# Patient Record
Sex: Male | Born: 1965 | Race: Black or African American | Hispanic: No | Marital: Single | Smoking: Current every day smoker
Health system: Southern US, Community
[De-identification: ages and names within clinical notes are randomized; demographics above are authoritative.]

## PROBLEM LIST (undated history)

## (undated) DIAGNOSIS — B1920 Unspecified viral hepatitis C without hepatic coma: Secondary | ICD-10-CM

## (undated) DIAGNOSIS — B20 Human immunodeficiency virus [HIV] disease: Secondary | ICD-10-CM

## (undated) HISTORY — DX: Unspecified viral hepatitis C without hepatic coma: B19.20

## (undated) HISTORY — PX: UMBILICAL HERNIA REPAIR: SHX196

---

## 1998-01-22 ENCOUNTER — Emergency Department (HOSPITAL_COMMUNITY): Admission: EM | Admit: 1998-01-22 | Discharge: 1998-01-22 | Payer: Self-pay | Admitting: Emergency Medicine

## 1998-10-28 ENCOUNTER — Emergency Department (HOSPITAL_COMMUNITY): Admission: EM | Admit: 1998-10-28 | Discharge: 1998-10-28 | Payer: Self-pay | Admitting: Emergency Medicine

## 1998-11-03 ENCOUNTER — Encounter: Admission: RE | Admit: 1998-11-03 | Discharge: 1998-11-03 | Payer: Self-pay | Admitting: Internal Medicine

## 1998-11-04 ENCOUNTER — Encounter: Admission: RE | Admit: 1998-11-04 | Discharge: 1998-11-04 | Payer: Self-pay | Admitting: Hematology and Oncology

## 1998-11-04 ENCOUNTER — Ambulatory Visit (HOSPITAL_COMMUNITY): Admission: RE | Admit: 1998-11-04 | Discharge: 1998-11-04 | Payer: Self-pay | Admitting: Hematology and Oncology

## 1998-11-04 ENCOUNTER — Ambulatory Visit (HOSPITAL_COMMUNITY): Admission: RE | Admit: 1998-11-04 | Discharge: 1998-11-04 | Payer: Self-pay | Admitting: Internal Medicine

## 1998-11-17 ENCOUNTER — Encounter: Admission: RE | Admit: 1998-11-17 | Discharge: 1998-11-17 | Payer: Self-pay | Admitting: Internal Medicine

## 1998-11-23 ENCOUNTER — Encounter: Admission: RE | Admit: 1998-11-23 | Discharge: 1998-11-23 | Payer: Self-pay | Admitting: Internal Medicine

## 1998-11-30 ENCOUNTER — Encounter: Admission: RE | Admit: 1998-11-30 | Discharge: 1998-11-30 | Payer: Self-pay | Admitting: Hematology and Oncology

## 1998-12-12 ENCOUNTER — Emergency Department (HOSPITAL_COMMUNITY): Admission: EM | Admit: 1998-12-12 | Discharge: 1998-12-12 | Payer: Self-pay | Admitting: Emergency Medicine

## 1998-12-22 ENCOUNTER — Encounter: Admission: RE | Admit: 1998-12-22 | Discharge: 1998-12-22 | Payer: Self-pay | Admitting: Internal Medicine

## 1998-12-22 ENCOUNTER — Ambulatory Visit (HOSPITAL_COMMUNITY): Admission: RE | Admit: 1998-12-22 | Discharge: 1998-12-22 | Payer: Self-pay | Admitting: Internal Medicine

## 1999-02-02 ENCOUNTER — Encounter: Admission: RE | Admit: 1999-02-02 | Discharge: 1999-02-02 | Payer: Self-pay | Admitting: Internal Medicine

## 1999-02-02 ENCOUNTER — Ambulatory Visit (HOSPITAL_COMMUNITY): Admission: RE | Admit: 1999-02-02 | Discharge: 1999-02-02 | Payer: Self-pay | Admitting: Internal Medicine

## 1999-03-31 ENCOUNTER — Ambulatory Visit (HOSPITAL_COMMUNITY): Admission: RE | Admit: 1999-03-31 | Discharge: 1999-03-31 | Payer: Self-pay | Admitting: Internal Medicine

## 1999-03-31 ENCOUNTER — Encounter: Admission: RE | Admit: 1999-03-31 | Discharge: 1999-03-31 | Payer: Self-pay | Admitting: Internal Medicine

## 1999-06-02 ENCOUNTER — Encounter: Admission: RE | Admit: 1999-06-02 | Discharge: 1999-06-02 | Payer: Self-pay | Admitting: Internal Medicine

## 1999-08-24 ENCOUNTER — Ambulatory Visit (HOSPITAL_COMMUNITY): Admission: RE | Admit: 1999-08-24 | Discharge: 1999-08-24 | Payer: Self-pay | Admitting: Internal Medicine

## 1999-08-24 ENCOUNTER — Encounter: Admission: RE | Admit: 1999-08-24 | Discharge: 1999-08-24 | Payer: Self-pay | Admitting: Internal Medicine

## 1999-12-07 ENCOUNTER — Ambulatory Visit (HOSPITAL_COMMUNITY): Admission: RE | Admit: 1999-12-07 | Discharge: 1999-12-07 | Payer: Self-pay | Admitting: Internal Medicine

## 1999-12-07 ENCOUNTER — Encounter: Admission: RE | Admit: 1999-12-07 | Discharge: 1999-12-07 | Payer: Self-pay | Admitting: Internal Medicine

## 2000-03-21 ENCOUNTER — Ambulatory Visit (HOSPITAL_COMMUNITY): Admission: RE | Admit: 2000-03-21 | Discharge: 2000-03-21 | Payer: Self-pay | Admitting: Internal Medicine

## 2000-03-21 ENCOUNTER — Encounter: Admission: RE | Admit: 2000-03-21 | Discharge: 2000-03-21 | Payer: Self-pay | Admitting: Internal Medicine

## 2000-06-27 ENCOUNTER — Encounter: Admission: RE | Admit: 2000-06-27 | Discharge: 2000-06-27 | Payer: Self-pay | Admitting: Internal Medicine

## 2000-06-27 ENCOUNTER — Ambulatory Visit (HOSPITAL_COMMUNITY): Admission: RE | Admit: 2000-06-27 | Discharge: 2000-06-27 | Payer: Self-pay | Admitting: Internal Medicine

## 2000-10-10 ENCOUNTER — Encounter: Admission: RE | Admit: 2000-10-10 | Discharge: 2000-10-10 | Payer: Self-pay | Admitting: Internal Medicine

## 2000-10-10 ENCOUNTER — Ambulatory Visit (HOSPITAL_COMMUNITY): Admission: RE | Admit: 2000-10-10 | Discharge: 2000-10-10 | Payer: Self-pay | Admitting: Internal Medicine

## 2001-03-13 ENCOUNTER — Encounter: Admission: RE | Admit: 2001-03-13 | Discharge: 2001-03-13 | Payer: Self-pay | Admitting: Internal Medicine

## 2001-03-13 ENCOUNTER — Ambulatory Visit (HOSPITAL_COMMUNITY): Admission: RE | Admit: 2001-03-13 | Discharge: 2001-03-13 | Payer: Self-pay | Admitting: Internal Medicine

## 2001-07-17 ENCOUNTER — Ambulatory Visit (HOSPITAL_COMMUNITY): Admission: RE | Admit: 2001-07-17 | Discharge: 2001-07-17 | Payer: Self-pay | Admitting: Internal Medicine

## 2001-07-17 ENCOUNTER — Encounter: Admission: RE | Admit: 2001-07-17 | Discharge: 2001-07-17 | Payer: Self-pay | Admitting: Internal Medicine

## 2001-09-10 ENCOUNTER — Encounter: Admission: RE | Admit: 2001-09-10 | Discharge: 2001-09-10 | Payer: Self-pay | Admitting: Internal Medicine

## 2001-11-27 ENCOUNTER — Encounter: Admission: RE | Admit: 2001-11-27 | Discharge: 2001-11-27 | Payer: Self-pay | Admitting: Internal Medicine

## 2001-11-27 ENCOUNTER — Ambulatory Visit (HOSPITAL_COMMUNITY): Admission: RE | Admit: 2001-11-27 | Discharge: 2001-11-27 | Payer: Self-pay | Admitting: Internal Medicine

## 2002-01-30 ENCOUNTER — Encounter: Admission: RE | Admit: 2002-01-30 | Discharge: 2002-01-30 | Payer: Self-pay | Admitting: Internal Medicine

## 2010-04-12 ENCOUNTER — Other Ambulatory Visit: Payer: Self-pay | Admitting: Internal Medicine

## 2010-04-12 ENCOUNTER — Emergency Department (HOSPITAL_COMMUNITY): Admission: EM | Admit: 2010-04-12 | Discharge: 2010-04-12 | Payer: Self-pay | Admitting: Family Medicine

## 2010-05-25 ENCOUNTER — Telehealth: Payer: Self-pay | Admitting: Internal Medicine

## 2010-05-25 ENCOUNTER — Ambulatory Visit: Payer: Self-pay | Admitting: Internal Medicine

## 2010-05-25 LAB — CONVERTED CEMR LAB
ALT: 30 units/L (ref 0–53)
AST: 27 units/L (ref 0–37)
Albumin: 4 g/dL (ref 3.5–5.2)
Alkaline Phosphatase: 70 units/L (ref 39–117)
BUN: 9 mg/dL (ref 6–23)
Basophils Absolute: 0 10*3/uL (ref 0.0–0.1)
Basophils Relative: 0 % (ref 0–1)
CO2: 24 meq/L (ref 19–32)
Calcium: 8.9 mg/dL (ref 8.4–10.5)
Chloride: 106 meq/L (ref 96–112)
Cholesterol: 173 mg/dL (ref 0–200)
Creatinine, Ser: 0.84 mg/dL (ref 0.40–1.50)
Eosinophils Absolute: 0.1 10*3/uL (ref 0.0–0.7)
Eosinophils Relative: 2 % (ref 0–5)
Glucose, Bld: 100 mg/dL — ABNORMAL HIGH (ref 70–99)
HCT: 45 % (ref 39.0–52.0)
HCV Ab: REACTIVE — AB
HDL: 77 mg/dL (ref >39)
HIV 1 RNA Quant: <20 copies/mL (ref <20)
HIV-1 RNA Quant, Log: <1.3 (ref <1.30)
Hemoglobin: 15.5 g/dL (ref 13.0–17.0)
Hep A Total Ab: POSITIVE — AB
LDL Cholesterol: 76 mg/dL (ref 0–99)
Lymphocytes Relative: 37 % (ref 12–46)
Lymphs Abs: 2.3 10*3/uL (ref 0.7–4.0)
MCHC: 34.4 g/dL (ref 30.0–36.0)
MCV: 93.9 fL (ref 78.0–100.0)
Monocytes Absolute: 0.5 10*3/uL (ref 0.1–1.0)
Monocytes Relative: 9 % (ref 3–12)
Neutro Abs: 3.3 10*3/uL (ref 1.7–7.7)
Neutrophils Relative %: 52 % (ref 43–77)
Platelets: 284 10*3/uL (ref 150–400)
Potassium: 4.4 meq/L (ref 3.5–5.3)
RBC: 4.79 M/uL (ref 4.22–5.81)
RDW: 13.7 % (ref 11.5–15.5)
Sodium: 140 meq/L (ref 135–145)
Total Bilirubin: 0.6 mg/dL (ref 0.3–1.2)
Total CHOL/HDL Ratio: 2.2
Total Protein: 6.9 g/dL (ref 6.0–8.3)
Triglycerides: 99 mg/dL (ref <150)
VLDL: 20 mg/dL (ref 0–40)
WBC: 6.3 10*3/uL (ref 4.0–10.5)

## 2010-05-26 ENCOUNTER — Encounter: Payer: Self-pay | Admitting: Internal Medicine

## 2010-05-26 LAB — CONVERTED CEMR LAB
Chlamydia, Swab/Urine, PCR: NEGATIVE
GC Probe Amp, Urine: NEGATIVE
Hemoglobin, Urine: NEGATIVE
Leukocytes, UA: NEGATIVE
Nitrite: NEGATIVE
Protein, ur: NEGATIVE mg/dL
Specific Gravity, Urine: 1.025 (ref 1.005–1.030)
Urine Glucose: NEGATIVE mg/dL
Urobilinogen, UA: 0.2 (ref 0.0–1.0)
pH: 5.5 (ref 5.0–8.0)

## 2010-05-27 ENCOUNTER — Emergency Department (HOSPITAL_COMMUNITY): Admission: EM | Admit: 2010-05-27 | Discharge: 2010-05-27 | Payer: Self-pay | Admitting: Family Medicine

## 2010-06-07 ENCOUNTER — Ambulatory Visit: Payer: Self-pay | Admitting: Internal Medicine

## 2010-06-07 DIAGNOSIS — B20 Human immunodeficiency virus [HIV] disease: Secondary | ICD-10-CM | POA: Insufficient documentation

## 2010-06-25 ENCOUNTER — Encounter: Payer: Self-pay | Admitting: Internal Medicine

## 2010-07-08 ENCOUNTER — Encounter: Payer: Self-pay | Admitting: Internal Medicine

## 2010-09-07 ENCOUNTER — Encounter: Payer: Self-pay | Admitting: Internal Medicine

## 2010-09-07 ENCOUNTER — Ambulatory Visit
Admission: RE | Admit: 2010-09-07 | Discharge: 2010-09-07 | Payer: Self-pay | Source: Home / Self Care | Attending: Internal Medicine | Admitting: Internal Medicine

## 2010-09-07 LAB — CONVERTED CEMR LAB
ALT: 33 units/L (ref 0–53)
AST: 29 units/L (ref 0–37)
Albumin: 4.3 g/dL (ref 3.5–5.2)
Alkaline Phosphatase: 79 units/L (ref 39–117)
BUN: 13 mg/dL (ref 6–23)
Basophils Absolute: 0 10*3/uL (ref 0.0–0.1)
Basophils Relative: 0 % (ref 0–1)
CO2: 24 meq/L (ref 19–32)
Calcium: 9.8 mg/dL (ref 8.4–10.5)
Chloride: 108 meq/L (ref 96–112)
Creatinine, Ser: 0.86 mg/dL (ref 0.40–1.50)
Eosinophils Absolute: 0.1 10*3/uL (ref 0.0–0.7)
Eosinophils Relative: 2 % (ref 0–5)
Glucose, Bld: 61 mg/dL — ABNORMAL LOW (ref 70–99)
HCT: 45.1 % (ref 39.0–52.0)
HIV 1 RNA Quant: <20 copies/mL (ref <20)
HIV-1 RNA Quant, Log: <1.3 (ref <1.30)
Hemoglobin: 15.3 g/dL (ref 13.0–17.0)
Lymphocytes Relative: 31 % (ref 12–46)
Lymphs Abs: 1.8 10*3/uL (ref 0.7–4.0)
MCHC: 33.9 g/dL (ref 30.0–36.0)
MCV: 97.6 fL (ref 78.0–100.0)
Monocytes Absolute: 0.6 10*3/uL (ref 0.1–1.0)
Monocytes Relative: 10 % (ref 3–12)
Neutro Abs: 3.3 10*3/uL (ref 1.7–7.7)
Neutrophils Relative %: 57 % (ref 43–77)
Platelets: 271 10*3/uL (ref 150–400)
Potassium: 5.1 meq/L (ref 3.5–5.3)
RBC: 4.62 M/uL (ref 4.22–5.81)
RDW: 13.1 % (ref 11.5–15.5)
Sodium: 142 meq/L (ref 135–145)
Total Bilirubin: 0.4 mg/dL (ref 0.3–1.2)
Total Protein: 7.1 g/dL (ref 6.0–8.3)
WBC: 5.8 10*3/uL (ref 4.0–10.5)

## 2010-09-10 ENCOUNTER — Encounter: Payer: Self-pay | Admitting: Internal Medicine

## 2010-09-20 ENCOUNTER — Ambulatory Visit: Admit: 2010-09-20 | Payer: Self-pay | Admitting: Internal Medicine

## 2010-10-07 NOTE — Miscellaneous (Signed)
Summary: MEDICAL REPORT  MEDICAL REPORT   Imported By: Margie Billet 07/08/2010 10:41:28  _____________________________________________________________________  External Attachment:    Type:   Image     Comment:   External Document

## 2010-10-07 NOTE — Miscellaneous (Signed)
Summary: RW Update  Clinical Lists Changes  Observations: Added new observation of RWPARTICIP: Yes (09/10/2010 10:28)

## 2010-10-07 NOTE — Miscellaneous (Signed)
Summary: Orders Update  Clinical Lists Changes  Orders: Added new Test order of T-Chlamydia  Probe, urine [id number]) - Signed Added new Test order of T-Comprehensive Metabolic Panel [id number]) - Signed Added new Test order of T-Lipid Profile ([id number]) - Signed Added new Test order of T-CBC w/Diff (44010-27253) - Signed Added new Test order of T-CD4SP [facility]) (CD4SP) - Signed Added new Test order of T-GC Probe, urine [phone]) - Signed Added new Test order of T-Hepatitis A Antibody ([id number]) - Signed Added new Test order of T-Hepatitis C Antibody ([id number]) - Signed Added new Test order of T-Urinalysis ([id number]) - Signed Added new Test order of T-HIV1 Quant rflx Ultra or Genotype ([id number]) - Signed Added new Test order of T-RPR (Syphilis) ([id number]) - Signed

## 2010-10-07 NOTE — Assessment & Plan Note (Signed)
Summary: new 042/tkk   CC:  new pt. to establish, lab results, and would like help in gaining weight.  History of Present Illness: This is the first ID clinic visit for [facility]. He recently moved here from [place] after his partner died.  He is living with his mother who helps take care of his 44 year old daughter. He was diagnosed HIV (+) in 2000.  Initially treated with combivir and Sustiva and then switched to Atripla which he tolerates well. He is in the process of applying for ADAP and was given a month supply of Atripla.  Preventive Screening-Counseling & Management  Alcohol-Tobacco     Alcohol drinks/day: occasional     Alcohol type: beer     Smoking Status: current     Packs/Day: 0.5     Year Started: 1978  Caffeine-Diet-Exercise     Caffeine use/day: none     Does Patient Exercise: no  Safety-Violence-Falls     Seat Belt Use: yes      Sexual History:  n/a.        Drug Use:  never.        Blood Transfusions:  no.    Comments: pt. given condoms   Updated Prior Medication List: ATRIPLA 600-200-300 MG TABS (EFAVIRENZ-EMTRICITAB-TENOFOVIR) Take 1 tablet by mouth at bedtime  Current Allergies (reviewed today): No known allergies  Social History: Sexual History:  n/a Drug Use:  never Blood Transfusions:  no  Review of Systems  The patient denies anorexia, fever, prolonged cough, and headaches.    Vital Signs:  Patient profile:   45 year old male Height:      67 inches (170.18 cm) Weight:      109.0 pounds (49.55 kg) BMI:     17.13 Temp:     97.8 degrees F (36.56 degrees C) oral Pulse rate:   76 / minute BP sitting:   121 / 84  (right arm)  Vitals Entered By: Wendall Mola CMA Duncan Dull) (June 07, 2010 2:11 PM) CC: new pt. to establish, lab results, would like help in gaining weight Is Patient Diabetic? No Pain Assessment Patient in pain? no      Nutritional Status BMI of < 19 = underweight Nutritional Status Detail appetite  "good"  Does patient need assistance? Functional Status Self care Ambulation Normal Comments no missed doses of meds per pt.   Physical Exam  General:  alert, well-developed, well-nourished, and well-hydrated.   Head:  normocephalic and atraumatic.   Mouth:  pharynx pink and moist.  no thrush  Lungs:  normal breath sounds.      Impression & Recommendations:  Problem # 1:  HIV INFECTION (ICD-042) Pt.s most recent CD4ct was 490 and VL <20.  Pt instructed to continue the current antiretroviral regimen.  Pt encouraged to take medication regularly and not miss doses.  Pt will f/u in 3 months for repeat blood work and will see me 2 weeks later.  Ptrefused Influenza vaccine.  Diagnostics Reviewed:  CD4: 490 (05/26/2010)   WBC: 6.3 (05/25/2010)   Hgb: 15.5 (05/25/2010)   HCT: 45.0 (05/25/2010)   Platelets: 284 (05/25/2010) HIV-1 RNA: <20 copies/mL (05/25/2010)     Orders: New Patient Level III (99203)Future Orders: T-CD4SP ([facility]) (CD4SP) ... 09/05/2010 T-HIV Viral Load [id number]) ... 09/05/2010 T-Comprehensive Metabolic Panel [phone]) ... 09/05/2010 T-CBC w/Diff ([id number]) ... 09/05/2010  Patient Instructions: 1)  Please schedule a follow-up appointment in 3 months, 2 weeks after labs.    Not Administered:  Influenza Vaccine not given due to: declined

## 2010-10-07 NOTE — Miscellaneous (Signed)
Summary: [place] [facility]  [place] [facility]   Imported By: Florinda Marker 06/28/2010 10:01:54  _____________________________________________________________________  External Attachment:    Type:   Image     Comment:   External Document

## 2010-10-07 NOTE — Progress Notes (Signed)
Summary: sample given to pt per [place]       New/Updated Medications: ATRIPLA 600-200-300 MG TABS (EFAVIRENZ-EMTRICITAB-TENOFOVIR) Take 1 tablet by mouth at bedtime Patient here for intake/labs.  He stated that he is transfering in from [place].  Patient said he has [place] Medicaid now.  He ran out of his Atripla last night and will need some to hold him over til seen by physician in 2 weeks.  I asked Dr. Orvan Falconer (the physician on site) if pateint can have a bottle of Atripla.  Dr. Orvan Falconer has approved this.  Sample Given, Lot #: [id number] Expiration Date:12 2013 Patient has been instructed regarding the correct time, dose and frequency of taking this med, including desired effects and most common side effects. Prescriptions: ATRIPLA 600-200-300 MG TABS (EFAVIRENZ-EMTRICITAB-TENOFOVIR) Take 1 tablet by mouth at bedtime  #30 x 0   Entered by:   Paulo Fruit  BS,CPht II,MPH   Authorized by:   Cliffton Asters MD   Signed by:   Cliffton Asters MD on 05/25/2010   Method used:   Samples Given   RxID:   [id number]  Paulo Fruit  BS,CPht II,MPH  May 25, 2010 9:18 AM

## 2010-10-14 ENCOUNTER — Encounter (INDEPENDENT_AMBULATORY_CARE_PROVIDER_SITE_OTHER): Payer: Self-pay | Admitting: *Deleted

## 2010-10-18 ENCOUNTER — Encounter: Payer: Self-pay | Admitting: Adult Health

## 2010-10-20 ENCOUNTER — Encounter (INDEPENDENT_AMBULATORY_CARE_PROVIDER_SITE_OTHER): Payer: Self-pay | Admitting: *Deleted

## 2010-10-21 NOTE — Miscellaneous (Signed)
  Clinical Lists Changes  Observations: Added new observation of PAYOR: Medicaid (10/14/2010 15:24) Added new observation of LATINO/HISP: No (10/14/2010 15:24) Added new observation of RACE: African American (10/14/2010 15:24)

## 2010-10-27 NOTE — Consult Note (Signed)
Summary: [facility]   Imported By: Florinda Marker 10/18/2010 13:25:27  _____________________________________________________________________  External Attachment:    Type:   Image     Comment:   External Document

## 2010-10-27 NOTE — Miscellaneous (Signed)
Summary: RW update  Clinical Lists Changes  Observations: Added new observation of GENDER: Male (10/20/2010 14:54) Added new observation of MARITAL STAT: Single (10/20/2010 14:54)

## 2010-11-15 ENCOUNTER — Ambulatory Visit (INDEPENDENT_AMBULATORY_CARE_PROVIDER_SITE_OTHER): Payer: Self-pay | Admitting: Infectious Diseases

## 2010-11-15 ENCOUNTER — Encounter: Payer: Self-pay | Admitting: Infectious Diseases

## 2010-11-15 ENCOUNTER — Telehealth: Payer: Self-pay | Admitting: *Deleted

## 2010-11-15 DIAGNOSIS — F17200 Nicotine dependence, unspecified, uncomplicated: Secondary | ICD-10-CM | POA: Insufficient documentation

## 2010-11-15 DIAGNOSIS — B20 Human immunodeficiency virus [HIV] disease: Secondary | ICD-10-CM

## 2010-11-15 DIAGNOSIS — Z23 Encounter for immunization: Secondary | ICD-10-CM

## 2010-11-15 LAB — T-HELPER CELL (CD4) - (RCID CLINIC ONLY)
CD4 % Helper T Cell: 23 % — ABNORMAL LOW (ref 33–55)
CD4 T Cell Abs: 430 uL (ref 400–2700)

## 2010-11-18 LAB — T-HELPER CELL (CD4) - (RCID CLINIC ONLY)
CD4 % Helper T Cell: 23 % — ABNORMAL LOW (ref 33–55)
CD4 T Cell Abs: 490 uL (ref 400–2700)

## 2010-11-19 LAB — HIV-1 RNA, QUALITATIVE, TMA: HIV-1 RNA, Qualitative, TMA: DETECTED

## 2010-11-19 LAB — HIV 1/2 CONFIRMATION
HIV-1 antibody: POSITIVE
HIV-2 Ab: NEGATIVE

## 2010-11-19 LAB — HEPATITIS PANEL, ACUTE
HCV Ab: REACTIVE — AB
Hep A IgM: NEGATIVE
Hep B C IgM: NEGATIVE
Hepatitis B Surface Ag: NEGATIVE

## 2010-11-19 LAB — T-HELPER CELLS (CD4) COUNT (NOT AT ARMC)
CD4 % Helper T Cell: 22 % — ABNORMAL LOW (ref 33–55)
CD4 T Cell Abs: 410 uL (ref 400–2700)

## 2010-11-19 LAB — RPR: RPR Ser Ql: NONREACTIVE

## 2010-11-23 NOTE — Miscellaneous (Signed)
  Clinical Lists Changes  Medications: Rx of ATRIPLA 600-200-300 MG TABS (EFAVIRENZ-EMTRICITAB-TENOFOVIR) Take 1 tablet by mouth at bedtime;  #30 x 5;  Signed;  Entered by: Wendall Mola CMA ( AAMA);  Authorized by: Johny Sax MD;  Method used: Electronically to [facility] [phone]*, [address] [place], [place]  [address], Ph: [phone], Fax: [phone] Orders: Added new Service order of Pneumococcal Vaccine (88416) - Signed Added new Service order of Admin 1st Vaccine (60630) - Signed Observations: Added new observation of PNEUMOVAXVIS: 08/10/09 version given November 15, 2010. (11/15/2010 11:05) Added new observation of PNEUMOVAXLOT: 1314AA (11/15/2010 11:05) Added new observation of PNEUMOVAXEXP: 01/23/2012 (11/15/2010 11:05) Added new observation of PNEUMOVAXBY: Wendall Mola CMA ( AAMA) (11/15/2010 11:05) Added new observation of PNEUMOVAXRTE: IM (11/15/2010 11:05) Added new observation of PNEUMOVAXDOS: 0.5 ml (11/15/2010 11:05) Added new observation of PNEUMOVAXMFR: Merck (11/15/2010 11:05) Added new observation of PNEUMOVAXSIT: right deltoid (11/15/2010 11:05) Added new observation of PNEUMOVAX: Pneumovax (11/15/2010 11:05)    Prescriptions: ATRIPLA 600-200-300 MG TABS (EFAVIRENZ-EMTRICITAB-TENOFOVIR) Take 1 tablet by mouth at bedtime  #30 x 5   Entered by:   Wendall Mola CMA ( AAMA)   Authorized by:   Johny Sax MD   Signed by:   Wendall Mola CMA ( AAMA) on 11/15/2010   Method used:   Electronically to        [facility] [address] [phone]* (retail)       [address]       [place], [place]  [address]       Ph: [phone]       Fax: [phone]   RxID:   [id number]    Immunizations Administered:  Pneumonia Vaccine:    Vaccine Type: Pneumovax    Site: right deltoid    Mfr: Merck    Dose: 0.5 ml    Route: IM    Given by: Wendall Mola CMA ( AAMA)    Exp. Date: 01/23/2012    Lot #: [id number]    VIS given:  08/10/09 version given November 15, 2010.

## 2010-11-23 NOTE — Progress Notes (Signed)
Summary: [facility] referral  Phone Note Outgoing Call   Call placed by: Annice Pih Summary of Call: Referral made to [facility] Initial call taken by: Wendall Mola CMA Duncan Dull),  November 15, 2010 11:10 AM

## 2010-11-23 NOTE — Assessment & Plan Note (Signed)
Summary: new to md 44month f/u [mkj]   Vital Signs:  Patient profile:   45 year old male Height:      67 inches (170.18 cm) Weight:      112.8 pounds (51.27 kg) BMI:     17.73 Temp:     98.5 degrees F (36.94 degrees C) oral Pulse rate:   90 / minute BP sitting:   110 / 85  (right arm)  Vitals Entered By: Wendall Mola CMA Duncan Dull) (November 15, 2010 10:24 AM) CC: follow-up visit, lab results Is Patient Diabetic? No Pain Assessment Patient in pain? no      Nutritional Status BMI of < 19 = underweight Nutritional Status Detail appetite "great"  Have you ever been in a relationship where you felt threatened, hurt or afraid?No   Does patient need assistance? Functional Status Self care Ambulation Normal Comments no missed doses of Atripla per pt.   CC:  follow-up visit and lab results.  History of Present Illness: 45 yo M with HIV+ diagnosed in 2000. Was prev in [place] for care, on atripla.  Feeling well, no problems with meds. Here with daughter today.  CD4 430 and VL <20 (09-07-10)   Preventive Screening-Counseling & Management  Alcohol-Tobacco     Alcohol drinks/day: occasional     Alcohol type: beer     Smoking Status: current     Packs/Day: 0.5     Year Started: 1978  Caffeine-Diet-Exercise     Caffeine use/day: none     Does Patient Exercise: yes     Type of exercise: walking  Safety-Violence-Falls     Seat Belt Use: yes      Sexual History:  n/a.        Drug Use:  yes.        Blood Transfusions:  no.    Comments: pt. declined condoms  Current Medications (verified): 1)  Atripla 600-200-300 Mg Tabs (Efavirenz-Emtricitab-Tenofovir) .... Take 1 Tablet By Mouth At Bedtime  Allergies (verified): No Known Drug Allergies  Past History:  Past Medical History: Current Problems:  TOBACCO ABUSE (ICD-305.1) HIV INFECTION (ICD-042)  Family History: mother with HTN. mother knows status.   Social History: Single Alcohol use-yes Drug use-yes Current  Smoker- 1/2 ppd, 3yrs.  Drug Use:  yes  Review of Systems       wt up 3#, eating well, BM nl.   Physical Exam  General:  well-developed, well-nourished, and well-hydrated.   Eyes:  pupils equal, pupils round, and pupils reactive to light.   Mouth:  pharynx pink and moist, no exudates, and poor dentition.   Neck:  no masses.   Lungs:  normal respiratory effort and normal breath sounds.   Heart:  normal rate, regular rhythm, and no murmur.   Abdomen:  soft, non-tender, and normal bowel sounds.     Impression & Recommendations:  Problem # 1:  HIV INFECTION (ICD-042) he is doing well. offered condoms, not sexually active. will have him seen in Dental clinic. will check his Hep B serologies at next visit.Give PNVX, he again refuses Fluvax.  WIll see him back in 4-34months.  Orders: Est. Patient Level IV (99214)Future Orders: T-CD4SP ([facility]) (CD4SP) ... 02/13/2011 T-HIV Viral Load [phone]) ... 02/13/2011 T-Comprehensive Metabolic Panel [id number]) ... 02/13/2011 T-CBC w/Diff ([id number]) ... 02/13/2011 T-RPR (Syphilis) [phone]) ... 02/13/2011 T-Lipid Profile [id number]) ... 02/13/2011 T-Hepatitis B Surface Antigen ([id number]) ... 02/13/2011 T-Hepatitis B Surface Antibody [id number]) ... 02/13/2011 T-Hepatitis B Core Antibody ([id number]) ... 02/13/2011  Problem #  2:  TOBACCO ABUSE (ICD-305.1) reinforced need to quit.     Orders Added: 1)  T-CD4SP [facility]) [CD4SP] 2)  T-HIV Viral Load [phone] 3)  T-Comprehensive Metabolic Panel [[id number]] 4)  T-CBC w/Diff [[id number]] 5)  Est. Patient Level IV [29562] 6)  T-RPR (Syphilis) [13086-57846] 7)  T-Lipid Profile [80061-22930] 8)  T-Hepatitis B Surface Antigen [96295-28413] 9)  T-Hepatitis B Surface Antibody [24401-02725] 10)  T-Hepatitis B Core Antibody [[id number]]   Not Administered:    Influenza Vaccine not given due to: declined         Medication Adherence: 11/15/2010   Adherence to  medications reviewed with patient. Counseling to provide adequate adherence provided   Prevention For Positives: 11/15/2010   Safe sex practices discussed with patient. Condoms offered.

## 2011-04-12 ENCOUNTER — Other Ambulatory Visit: Payer: Self-pay | Admitting: *Deleted

## 2011-04-12 MED ORDER — EFAVIRENZ-EMTRICITAB-TENOFOVIR 600-200-300 MG PO TABS: 1.0000 | ORAL_TABLET | Freq: Every day | ORAL | Status: DC

## 2011-06-20 ENCOUNTER — Other Ambulatory Visit: Payer: Self-pay

## 2011-06-20 ENCOUNTER — Other Ambulatory Visit: Payer: Self-pay | Admitting: Infectious Diseases

## 2011-06-20 DIAGNOSIS — B20 Human immunodeficiency virus [HIV] disease: Secondary | ICD-10-CM

## 2011-06-27 ENCOUNTER — Other Ambulatory Visit: Payer: Self-pay

## 2011-07-04 ENCOUNTER — Ambulatory Visit: Payer: Self-pay | Admitting: Infectious Diseases

## 2011-07-11 ENCOUNTER — Other Ambulatory Visit: Payer: Self-pay

## 2011-07-11 ENCOUNTER — Other Ambulatory Visit (INDEPENDENT_AMBULATORY_CARE_PROVIDER_SITE_OTHER): Payer: Self-pay

## 2011-07-11 ENCOUNTER — Other Ambulatory Visit: Payer: Self-pay | Admitting: Infectious Diseases

## 2011-07-11 ENCOUNTER — Other Ambulatory Visit: Payer: Self-pay | Admitting: *Deleted

## 2011-07-11 DIAGNOSIS — Z79899 Other long term (current) drug therapy: Secondary | ICD-10-CM

## 2011-07-11 DIAGNOSIS — Z113 Encounter for screening for infections with a predominantly sexual mode of transmission: Secondary | ICD-10-CM

## 2011-07-11 DIAGNOSIS — B20 Human immunodeficiency virus [HIV] disease: Secondary | ICD-10-CM

## 2011-07-11 LAB — CBC WITH DIFFERENTIAL/PLATELET
Basophils Absolute: 0 10*3/uL (ref 0.0–0.1)
Basophils Relative: 0 % (ref 0–1)
Eosinophils Absolute: 0.1 10*3/uL (ref 0.0–0.7)
Eosinophils Relative: 2 % (ref 0–5)
HCT: 43.2 % (ref 39.0–52.0)
Hemoglobin: 15 g/dL (ref 13.0–17.0)
Lymphocytes Relative: 28 % (ref 12–46)
Lymphs Abs: 1.7 10*3/uL (ref 0.7–4.0)
MCH: 34.6 pg — ABNORMAL HIGH (ref 26.0–34.0)
MCHC: 34.7 g/dL (ref 30.0–36.0)
MCV: 99.5 fL (ref 78.0–100.0)
Monocytes Absolute: 0.6 10*3/uL (ref 0.1–1.0)
Monocytes Relative: 10 % (ref 3–12)
Neutro Abs: 3.6 10*3/uL (ref 1.7–7.7)
Neutrophils Relative %: 60 % (ref 43–77)
Platelets: 295 10*3/uL (ref 150–400)
RBC: 4.34 MIL/uL (ref 4.22–5.81)
RDW: 12.8 % (ref 11.5–15.5)
WBC: 6 10*3/uL (ref 4.0–10.5)

## 2011-07-11 LAB — LIPID PANEL
Cholesterol: 173 mg/dL (ref 0–200)
HDL: 86 mg/dL (ref >39)
LDL Cholesterol: 76 mg/dL (ref 0–99)
Total CHOL/HDL Ratio: 2 Ratio
Triglycerides: 57 mg/dL (ref <150)
VLDL: 11 mg/dL (ref 0–40)

## 2011-07-11 LAB — COMPREHENSIVE METABOLIC PANEL
ALT: 41 U/L (ref 0–53)
AST: 36 U/L (ref 0–37)
Albumin: 3.9 g/dL (ref 3.5–5.2)
Alkaline Phosphatase: 85 U/L (ref 39–117)
BUN: 12 mg/dL (ref 6–23)
CO2: 27 mEq/L (ref 19–32)
Calcium: 9.5 mg/dL (ref 8.4–10.5)
Chloride: 105 mEq/L (ref 96–112)
Creat: 0.9 mg/dL (ref 0.50–1.35)
Glucose, Bld: 97 mg/dL (ref 70–99)
Potassium: 4.6 mEq/L (ref 3.5–5.3)
Sodium: 139 mEq/L (ref 135–145)
Total Bilirubin: 0.4 mg/dL (ref 0.3–1.2)
Total Protein: 6.7 g/dL (ref 6.0–8.3)

## 2011-07-11 LAB — RPR

## 2011-07-11 MED ORDER — EFAVIRENZ-EMTRICITAB-TENOFOVIR 600-200-300 MG PO TABS: 1.0000 | ORAL_TABLET | Freq: Every day | ORAL | Status: DC

## 2011-07-12 LAB — T-HELPER CELL (CD4) - (RCID CLINIC ONLY)
CD4 % Helper T Cell: 27 % — ABNORMAL LOW (ref 33–55)
CD4 T Cell Abs: 480 uL (ref 400–2700)

## 2011-07-13 LAB — HIV-1 RNA QUANT-NO REFLEX-BLD
HIV 1 RNA Quant: <20 copies/mL (ref <20)
HIV-1 RNA Quant, Log: <1.3 {Log} (ref <1.30)

## 2011-07-18 ENCOUNTER — Telehealth: Payer: Self-pay | Admitting: *Deleted

## 2011-07-18 DIAGNOSIS — B20 Human immunodeficiency virus [HIV] disease: Secondary | ICD-10-CM

## 2011-07-18 MED ORDER — EFAVIRENZ-EMTRICITAB-TENOFOVIR 600-200-300 MG PO TABS: 1.0000 | ORAL_TABLET | Freq: Every day | ORAL | Status: DC

## 2011-07-18 NOTE — Telephone Encounter (Signed)
Pt needing rx sent to [facility], [place], [place].

## 2011-08-08 ENCOUNTER — Encounter: Payer: Self-pay | Admitting: Infectious Diseases

## 2011-08-08 ENCOUNTER — Ambulatory Visit (INDEPENDENT_AMBULATORY_CARE_PROVIDER_SITE_OTHER): Payer: Self-pay | Admitting: Infectious Diseases

## 2011-08-08 ENCOUNTER — Ambulatory Visit: Payer: Self-pay | Admitting: Infectious Diseases

## 2011-08-08 DIAGNOSIS — Z23 Encounter for immunization: Secondary | ICD-10-CM

## 2011-08-08 DIAGNOSIS — B20 Human immunodeficiency virus [HIV] disease: Secondary | ICD-10-CM

## 2011-08-08 DIAGNOSIS — Z113 Encounter for screening for infections with a predominantly sexual mode of transmission: Secondary | ICD-10-CM

## 2011-08-08 DIAGNOSIS — Z79899 Other long term (current) drug therapy: Secondary | ICD-10-CM

## 2011-08-08 NOTE — Progress Notes (Signed)
  Subjective:    Patient ID: George Boyle, male    DOB: 1966-02-25, 45 y.o.   MRN: [id number]  HPI 45 yo M with HIV+ diagnosed in 2000. Was prev in [place] for care, on atripla.  Feeling well, no problems with meds. Here with daughter today.  CD4 480 and VL <20 (07-11-11). Feels well today. No problems with atripla. Wt down 2#.      Review of Systems  Constitutional: Negative for fever, chills and appetite change.  Gastrointestinal: Negative for diarrhea and constipation.  Genitourinary: Negative for dysuria.       Objective:   Physical Exam  Constitutional: He appears well-developed and well-nourished.  Eyes: EOM are normal. Pupils are equal, round, and reactive to light.  Neck: Neck supple.  Cardiovascular: Normal rate, regular rhythm and normal heart sounds.   Pulmonary/Chest: Effort normal and breath sounds normal.  Abdominal: Soft. Bowel sounds are normal. There is no tenderness.  Lymphadenopathy:    He has no cervical adenopathy.          Assessment & Plan:

## 2011-08-08 NOTE — Assessment & Plan Note (Signed)
He appears to be doing well. Major concern would be his weight at this point. Will continue to watch. He states that he does have enough food available to eat. He is given condoms. Flu shot. Will see him back in 5-6 months with labs prior. He is Hep A immune.

## 2012-03-05 ENCOUNTER — Other Ambulatory Visit (INDEPENDENT_AMBULATORY_CARE_PROVIDER_SITE_OTHER): Payer: Self-pay

## 2012-03-05 DIAGNOSIS — Z113 Encounter for screening for infections with a predominantly sexual mode of transmission: Secondary | ICD-10-CM

## 2012-03-05 DIAGNOSIS — B20 Human immunodeficiency virus [HIV] disease: Secondary | ICD-10-CM

## 2012-03-05 DIAGNOSIS — Z79899 Other long term (current) drug therapy: Secondary | ICD-10-CM

## 2012-03-05 LAB — COMPREHENSIVE METABOLIC PANEL
ALT: 39 U/L (ref 0–53)
AST: 34 U/L (ref 0–37)
Albumin: 4 g/dL (ref 3.5–5.2)
Alkaline Phosphatase: 69 U/L (ref 39–117)
BUN: 7 mg/dL (ref 6–23)
CO2: 26 mEq/L (ref 19–32)
Calcium: 9.3 mg/dL (ref 8.4–10.5)
Chloride: 107 mEq/L (ref 96–112)
Creat: 0.78 mg/dL (ref 0.50–1.35)
Glucose, Bld: 69 mg/dL — ABNORMAL LOW (ref 70–99)
Potassium: 4.9 mEq/L (ref 3.5–5.3)
Sodium: 140 mEq/L (ref 135–145)
Total Bilirubin: 0.5 mg/dL (ref 0.3–1.2)
Total Protein: 6.6 g/dL (ref 6.0–8.3)

## 2012-03-05 LAB — LIPID PANEL
Cholesterol: 186 mg/dL (ref 0–200)
HDL: 80 mg/dL (ref >39)
LDL Cholesterol: 89 mg/dL (ref 0–99)
Total CHOL/HDL Ratio: 2.3 Ratio
Triglycerides: 86 mg/dL (ref <150)
VLDL: 17 mg/dL (ref 0–40)

## 2012-03-05 LAB — CBC
HCT: 42 % (ref 39.0–52.0)
Hemoglobin: 14.8 g/dL (ref 13.0–17.0)
MCH: 32.7 pg (ref 26.0–34.0)
MCHC: 35.2 g/dL (ref 30.0–36.0)
MCV: 92.9 fL (ref 78.0–100.0)
Platelets: 273 10*3/uL (ref 150–400)
RBC: 4.52 MIL/uL (ref 4.22–5.81)
RDW: 13.5 % (ref 11.5–15.5)
WBC: 5 10*3/uL (ref 4.0–10.5)

## 2012-03-06 LAB — T-HELPER CELL (CD4) - (RCID CLINIC ONLY): CD4 % Helper T Cell: 28 % — ABNORMAL LOW (ref 33–55)

## 2012-03-07 LAB — HIV-1 RNA QUANT-NO REFLEX-BLD
HIV 1 RNA Quant: <20 copies/mL (ref <20)
HIV-1 RNA Quant, Log: <1.3 {Log} (ref <1.30)

## 2012-03-19 ENCOUNTER — Ambulatory Visit (INDEPENDENT_AMBULATORY_CARE_PROVIDER_SITE_OTHER): Payer: Self-pay | Admitting: Infectious Diseases

## 2012-03-19 ENCOUNTER — Encounter: Payer: Self-pay | Admitting: Infectious Diseases

## 2012-03-19 VITALS — BP 108/77 | HR 84 | Temp 97.9°F | Wt 109.0 lb

## 2012-03-19 DIAGNOSIS — B20 Human immunodeficiency virus [HIV] disease: Secondary | ICD-10-CM

## 2012-03-19 DIAGNOSIS — Z79899 Other long term (current) drug therapy: Secondary | ICD-10-CM

## 2012-03-19 DIAGNOSIS — Z113 Encounter for screening for infections with a predominantly sexual mode of transmission: Secondary | ICD-10-CM

## 2012-03-19 NOTE — Progress Notes (Signed)
  Subjective:    Patient ID: George Boyle, male    DOB: [birth year], 46 y.o.   MRN: [id number]  HPI 46 yo M with HIV+ diagnosed in 2000. Was prev in [place] for care, on atripla.  Feeling well, no problems with meds. Here with daughter today.  Without complaints. Wt is about the same. Eating well.   HIV 1 RNA Quant (copies/mL)  Date Value  03/05/2012 <20   07/11/2011 <20   09/07/2010 <20 copies/mL      CD4 T Cell Abs (cmm)  Date Value  03/05/2012 470   07/11/2011 480   09/07/2010 430       Review of Systems     Objective:   Physical Exam  Constitutional: He appears well-developed and well-nourished.  HENT:  Mouth/Throat: Oropharynx is clear and moist. No oropharyngeal exudate.  Eyes: EOM are normal. Pupils are equal, round, and reactive to light.  Neck: Neck supple.  Cardiovascular: Normal rate, regular rhythm and normal heart sounds.   Pulmonary/Chest: Effort normal and breath sounds normal.  Abdominal: Soft. Bowel sounds are normal. He exhibits no distension. There is no tenderness.  Lymphadenopathy:    He has no cervical adenopathy.          Assessment & Plan:

## 2012-03-19 NOTE — Assessment & Plan Note (Signed)
He is doing very well. No change to his ART. His vaccines are up to date. He is offered/refuses condoms. Will see him back in 6 months.

## 2012-04-02 ENCOUNTER — Telehealth: Payer: Self-pay | Admitting: Licensed Clinical Social Worker

## 2012-04-02 NOTE — Telephone Encounter (Signed)
Patient called stating that he has a "chest cold" and wanted a z-pac. He denies fever, or sob. He states that he has a cough that started several days ago. He wanted to know what otc medication he could try, I advised Mucinex and if his symptoms aren't any better to give [place] a call for an appointment. Patient seem to understand and agree.

## 2012-07-20 ENCOUNTER — Other Ambulatory Visit: Payer: Self-pay | Admitting: Licensed Clinical Social Worker

## 2012-07-20 DIAGNOSIS — B20 Human immunodeficiency virus [HIV] disease: Secondary | ICD-10-CM

## 2012-07-20 MED ORDER — EFAVIRENZ-EMTRICITAB-TENOFOVIR 600-200-300 MG PO TABS: 1.0000 | ORAL_TABLET | Freq: Every day | ORAL | Status: DC

## 2012-10-29 ENCOUNTER — Other Ambulatory Visit: Payer: Self-pay

## 2012-11-05 ENCOUNTER — Other Ambulatory Visit: Payer: Self-pay

## 2012-11-12 ENCOUNTER — Other Ambulatory Visit (INDEPENDENT_AMBULATORY_CARE_PROVIDER_SITE_OTHER): Payer: Self-pay

## 2012-11-12 ENCOUNTER — Ambulatory Visit: Payer: Self-pay | Admitting: Infectious Diseases

## 2012-11-12 DIAGNOSIS — B20 Human immunodeficiency virus [HIV] disease: Secondary | ICD-10-CM

## 2012-11-12 DIAGNOSIS — Z113 Encounter for screening for infections with a predominantly sexual mode of transmission: Secondary | ICD-10-CM

## 2012-11-12 DIAGNOSIS — Z79899 Other long term (current) drug therapy: Secondary | ICD-10-CM

## 2012-11-12 LAB — CBC
MCH: 32.3 pg (ref 26.0–34.0)
MCHC: 34.2 g/dL (ref 30.0–36.0)
RDW: 14.1 % (ref 11.5–15.5)

## 2012-11-13 LAB — COMPREHENSIVE METABOLIC PANEL
AST: 31 U/L (ref 0–37)
Albumin: 4.4 g/dL (ref 3.5–5.2)
Alkaline Phosphatase: 76 U/L (ref 39–117)
BUN: 10 mg/dL (ref 6–23)
Glucose, Bld: 104 mg/dL — ABNORMAL HIGH (ref 70–99)
Potassium: 4.9 mEq/L (ref 3.5–5.3)
Sodium: 140 mEq/L (ref 135–145)
Total Bilirubin: 0.6 mg/dL (ref 0.3–1.2)

## 2012-11-13 LAB — HIV-1 RNA QUANT-NO REFLEX-BLD
HIV 1 RNA Quant: <20 copies/mL (ref <20)
HIV-1 RNA Quant, Log: <1.3 {Log} (ref <1.30)

## 2012-11-13 LAB — LIPID PANEL
HDL: 97 mg/dL (ref >39)
LDL Cholesterol: 84 mg/dL (ref 0–99)
Triglycerides: 52 mg/dL (ref <150)
VLDL: 10 mg/dL (ref 0–40)

## 2012-11-13 LAB — RPR

## 2012-11-26 ENCOUNTER — Encounter: Payer: Self-pay | Admitting: Infectious Diseases

## 2012-11-26 ENCOUNTER — Ambulatory Visit (INDEPENDENT_AMBULATORY_CARE_PROVIDER_SITE_OTHER): Payer: Self-pay | Admitting: Infectious Diseases

## 2012-11-26 VITALS — BP 106/76 | HR 80 | Temp 97.9°F | Ht 66.0 in | Wt 113.0 lb

## 2012-11-26 DIAGNOSIS — B20 Human immunodeficiency virus [HIV] disease: Secondary | ICD-10-CM

## 2012-11-26 NOTE — Progress Notes (Signed)
  Subjective:    Patient ID: Cathlean Sauer, male    DOB: 1966-04-15, 47 y.o.   MRN: [id number]  HPI 47 yo M with HIV+ diagnosed in 2000. Was prev in [place] for care, on atripla. Been feeling well.   HIV 1 RNA Quant (copies/mL)  Date Value  11/12/2012 <20   03/05/2012 <20   07/11/2011 <20      CD4 T Cell Abs (cmm)  Date Value  11/12/2012 520   03/05/2012 470   07/11/2011 480       Review of Systems  Constitutional: Negative for fever, chills, appetite change and unexpected weight change.  Gastrointestinal: Negative for diarrhea and constipation.  Genitourinary: Negative for difficulty urinating.       Objective:   Physical Exam  Constitutional: He appears well-developed and well-nourished.  HENT:  Mouth/Throat: No oropharyngeal exudate.  Eyes: EOM are normal. Pupils are equal, round, and reactive to light.  Neck: Neck supple.  Cardiovascular: Normal rate, regular rhythm and normal heart sounds.   Pulmonary/Chest: Effort normal and breath sounds normal.  Abdominal: Soft. Bowel sounds are normal. There is no tenderness.  Lymphadenopathy:    He has no cervical adenopathy.          Assessment & Plan:

## 2012-11-26 NOTE — Assessment & Plan Note (Signed)
He is doing very well. Will continue his current meds. Offered/refuses condoms. Will try to get into dental. Will see him back in 6 months. Missed fluvax. Prev Hep B S Ab+.

## 2013-06-25 ENCOUNTER — Other Ambulatory Visit: Payer: Self-pay | Admitting: *Deleted

## 2013-06-25 DIAGNOSIS — B20 Human immunodeficiency virus [HIV] disease: Secondary | ICD-10-CM

## 2013-06-25 MED ORDER — EFAVIRENZ-EMTRICITAB-TENOFOVIR 600-200-300 MG PO TABS: 1.0000 | ORAL_TABLET | Freq: Every day | ORAL | Status: DC

## 2013-10-14 ENCOUNTER — Other Ambulatory Visit (INDEPENDENT_AMBULATORY_CARE_PROVIDER_SITE_OTHER): Payer: Self-pay

## 2013-10-14 DIAGNOSIS — B20 Human immunodeficiency virus [HIV] disease: Secondary | ICD-10-CM

## 2013-10-14 LAB — CBC
HCT: 46.2 % (ref 39.0–52.0)
Hemoglobin: 16.2 g/dL (ref 13.0–17.0)
MCH: 32.5 pg (ref 26.0–34.0)
MCHC: 35.1 g/dL (ref 30.0–36.0)
MCV: 92.6 fL (ref 78.0–100.0)
PLATELETS: 284 10*3/uL (ref 150–400)
RBC: 4.99 MIL/uL (ref 4.22–5.81)
RDW: 13.5 % (ref 11.5–15.5)
WBC: 5.5 10*3/uL (ref 4.0–10.5)

## 2013-10-14 LAB — COMPREHENSIVE METABOLIC PANEL
ALT: 35 U/L (ref 0–53)
AST: 34 U/L (ref 0–37)
Albumin: 4.1 g/dL (ref 3.5–5.2)
Alkaline Phosphatase: 83 U/L (ref 39–117)
BUN: 9 mg/dL (ref 6–23)
CALCIUM: 9.3 mg/dL (ref 8.4–10.5)
CHLORIDE: 105 meq/L (ref 96–112)
CO2: 25 meq/L (ref 19–32)
Creat: 0.86 mg/dL (ref 0.50–1.35)
Glucose, Bld: 97 mg/dL (ref 70–99)
Potassium: 4.3 mEq/L (ref 3.5–5.3)
SODIUM: 139 meq/L (ref 135–145)
TOTAL PROTEIN: 7.2 g/dL (ref 6.0–8.3)
Total Bilirubin: 0.4 mg/dL (ref 0.2–1.2)

## 2013-10-15 LAB — HIV-1 RNA QUANT-NO REFLEX-BLD: HIV-1 RNA Quant, Log: <1.3 {Log} (ref <1.30)

## 2013-10-15 LAB — T-HELPER CELL (CD4) - (RCID CLINIC ONLY)
CD4 % Helper T Cell: 27 % — ABNORMAL LOW (ref 33–55)
CD4 T CELL ABS: 500 /uL (ref 400–2700)

## 2013-10-28 ENCOUNTER — Encounter: Payer: Self-pay | Admitting: Infectious Diseases

## 2013-10-28 ENCOUNTER — Ambulatory Visit (INDEPENDENT_AMBULATORY_CARE_PROVIDER_SITE_OTHER): Payer: Self-pay | Admitting: Infectious Diseases

## 2013-10-28 VITALS — BP 118/86 | HR 68 | Temp 98.4°F | Ht 67.0 in | Wt 110.0 lb

## 2013-10-28 DIAGNOSIS — Z113 Encounter for screening for infections with a predominantly sexual mode of transmission: Secondary | ICD-10-CM

## 2013-10-28 DIAGNOSIS — B20 Human immunodeficiency virus [HIV] disease: Secondary | ICD-10-CM

## 2013-10-28 DIAGNOSIS — K649 Unspecified hemorrhoids: Secondary | ICD-10-CM | POA: Insufficient documentation

## 2013-10-28 DIAGNOSIS — Z79899 Other long term (current) drug therapy: Secondary | ICD-10-CM

## 2013-10-28 NOTE — Progress Notes (Signed)
   Subjective:    Patient ID: George Boyle, male    DOB: 06/05/1966, 48 y.o.   MRN: [id number]  HPI 48 yo M with HIV+ diagnosed in 2000. Was prev in [place] for care, on atripla. Previous Hep B immune.  No problems with meds.   HIV 1 RNA Quant (copies/mL)  Date Value  10/14/2013 <20   11/12/2012 <20   03/05/2012 <20      CD4 T Cell Abs (/uL)  Date Value  10/14/2013 500   11/12/2012 520   03/05/2012 470     Review of Systems  Constitutional: Negative for fever, chills, appetite change and unexpected weight change.  Gastrointestinal: Negative for diarrhea and constipation.  Genitourinary: Negative for difficulty urinating.      Objective:   Physical Exam  Constitutional: He appears well-developed and well-nourished.  HENT:  Mouth/Throat: No oropharyngeal exudate.  Eyes: EOM are normal. Pupils are equal, round, and reactive to light.  Neck: Neck supple.  Cardiovascular: Normal rate, regular rhythm and normal heart sounds.   Pulmonary/Chest: Effort normal and breath sounds normal.  Abdominal: Soft. Bowel sounds are normal. There is no tenderness. There is no rebound.  Genitourinary:     Lymphadenopathy:    He has no cervical adenopathy.          Assessment & Plan:

## 2013-10-28 NOTE — Assessment & Plan Note (Signed)
Sent anal pap. Could be skin tag. Advised pt to use OTC with steroid.

## 2013-10-28 NOTE — Assessment & Plan Note (Addendum)
He is doing very well- will see him back in 6 months- lipids, urine std testing as well.  Last CBC and CMP normal. Needs dental

## 2014-02-27 ENCOUNTER — Other Ambulatory Visit: Payer: Self-pay | Admitting: Internal Medicine

## 2014-03-24 ENCOUNTER — Other Ambulatory Visit: Payer: Self-pay | Admitting: Internal Medicine

## 2014-03-24 DIAGNOSIS — B20 Human immunodeficiency virus [HIV] disease: Secondary | ICD-10-CM

## 2014-05-22 ENCOUNTER — Other Ambulatory Visit: Payer: Self-pay | Admitting: Infectious Diseases

## 2014-06-02 ENCOUNTER — Other Ambulatory Visit (INDEPENDENT_AMBULATORY_CARE_PROVIDER_SITE_OTHER): Payer: Self-pay

## 2014-06-02 DIAGNOSIS — Z113 Encounter for screening for infections with a predominantly sexual mode of transmission: Secondary | ICD-10-CM

## 2014-06-02 DIAGNOSIS — Z79899 Other long term (current) drug therapy: Secondary | ICD-10-CM

## 2014-06-02 DIAGNOSIS — B20 Human immunodeficiency virus [HIV] disease: Secondary | ICD-10-CM

## 2014-06-02 LAB — LIPID PANEL
Cholesterol: 169 mg/dL (ref 0–200)
HDL: 83 mg/dL
LDL Cholesterol: 70 mg/dL (ref 0–99)
Total CHOL/HDL Ratio: 2 ratio
Triglycerides: 79 mg/dL
VLDL: 16 mg/dL (ref 0–40)

## 2014-06-03 LAB — T-HELPER CELL (CD4) - (RCID CLINIC ONLY)
CD4 T CELL ABS: 520 /uL (ref 400–2700)
CD4 T CELL HELPER: 30 % — AB (ref 33–55)

## 2014-06-03 LAB — URINE CYTOLOGY ANCILLARY ONLY
Chlamydia: NEGATIVE
Neisseria Gonorrhea: NEGATIVE

## 2014-06-04 LAB — HIV-1 RNA QUANT-NO REFLEX-BLD

## 2014-06-16 ENCOUNTER — Ambulatory Visit (INDEPENDENT_AMBULATORY_CARE_PROVIDER_SITE_OTHER): Payer: Self-pay | Admitting: Infectious Diseases

## 2014-06-16 ENCOUNTER — Encounter: Payer: Self-pay | Admitting: Infectious Diseases

## 2014-06-16 VITALS — BP 133/95 | HR 84 | Temp 98.1°F | Wt 109.0 lb

## 2014-06-16 DIAGNOSIS — Z113 Encounter for screening for infections with a predominantly sexual mode of transmission: Secondary | ICD-10-CM

## 2014-06-16 DIAGNOSIS — Z79899 Other long term (current) drug therapy: Secondary | ICD-10-CM

## 2014-06-16 DIAGNOSIS — Z23 Encounter for immunization: Secondary | ICD-10-CM

## 2014-06-16 DIAGNOSIS — B20 Human immunodeficiency virus [HIV] disease: Secondary | ICD-10-CM

## 2014-06-16 NOTE — Assessment & Plan Note (Signed)
He is doing very well. Gets flu shot today. Offered/refused condoms. Will see him back in 6 months. He has Hep C Ab+. Will check genotype and VL at next visit.

## 2014-06-16 NOTE — Progress Notes (Signed)
   Subjective:    Patient ID: George Boyle, male    DOB: 12/18/1965, 48 y.o.   MRN: [id number]  HPI 48 yo M with HIV+ diagnosed in 2000. Was prev in [place] for care, on atripla.  Previous Hep B immune. Had anal pap 10-2013 that was (-).  No problems with atripla.   HIV 1 RNA Quant (copies/mL)  Date Value  06/02/2014 <20   10/14/2013 <20   11/12/2012 <20      CD4 T Cell Abs (/uL)  Date Value  06/02/2014 520   10/14/2013 500   11/12/2012 520     Review of Systems  Constitutional: Negative for appetite change and unexpected weight change.  Gastrointestinal: Negative for diarrhea and constipation.  Genitourinary: Negative for difficulty urinating.       Objective:   Physical Exam  Constitutional: He appears well-developed and well-nourished.  HENT:  Mouth/Throat: No oropharyngeal exudate.  Eyes: EOM are normal. Pupils are equal, round, and reactive to light.  Neck: Neck supple.  Cardiovascular: Normal rate, regular rhythm and normal heart sounds.   Pulmonary/Chest: Effort normal and breath sounds normal.  Abdominal: Soft. Bowel sounds are normal.  Lymphadenopathy:    He has no cervical adenopathy.          Assessment & Plan:

## 2014-07-10 ENCOUNTER — Other Ambulatory Visit: Payer: Self-pay | Admitting: Licensed Clinical Social Worker

## 2014-07-10 MED ORDER — EFAVIRENZ-EMTRICITAB-TENOFOVIR 600-200-300 MG PO TABS: ORAL_TABLET | ORAL | Status: DC

## 2015-02-23 ENCOUNTER — Other Ambulatory Visit: Payer: Self-pay | Admitting: *Deleted

## 2015-02-23 ENCOUNTER — Other Ambulatory Visit: Payer: Self-pay | Admitting: Infectious Diseases

## 2015-02-23 MED ORDER — EFAVIRENZ-EMTRICITAB-TENOFOVIR 600-200-300 MG PO TABS: ORAL_TABLET | ORAL | Status: DC

## 2015-03-02 ENCOUNTER — Other Ambulatory Visit: Payer: Self-pay

## 2015-03-16 ENCOUNTER — Other Ambulatory Visit (INDEPENDENT_AMBULATORY_CARE_PROVIDER_SITE_OTHER): Payer: Self-pay

## 2015-03-16 DIAGNOSIS — B20 Human immunodeficiency virus [HIV] disease: Secondary | ICD-10-CM

## 2015-03-16 DIAGNOSIS — Z79899 Other long term (current) drug therapy: Secondary | ICD-10-CM

## 2015-03-16 DIAGNOSIS — Z113 Encounter for screening for infections with a predominantly sexual mode of transmission: Secondary | ICD-10-CM

## 2015-03-16 LAB — CBC
HCT: 43.3 % (ref 39.0–52.0)
Hemoglobin: 14.5 g/dL (ref 13.0–17.0)
MCH: 33.3 pg (ref 26.0–34.0)
MCHC: 33.5 g/dL (ref 30.0–36.0)
MCV: 99.5 fL (ref 78.0–100.0)
MPV: 10.1 fL (ref 8.6–12.4)
PLATELETS: 245 10*3/uL (ref 150–400)
RBC: 4.35 MIL/uL (ref 4.22–5.81)
RDW: 14.2 % (ref 11.5–15.5)
WBC: 5.4 10*3/uL (ref 4.0–10.5)

## 2015-03-16 NOTE — Addendum Note (Signed)
Addended by: Dolan Amen D on: 03/16/2015 12:40 PM   Modules accepted: Orders

## 2015-03-17 LAB — COMPREHENSIVE METABOLIC PANEL
ALBUMIN: 3.8 g/dL (ref 3.5–5.2)
ALT: 34 U/L (ref 0–53)
AST: 34 U/L (ref 0–37)
Alkaline Phosphatase: 95 U/L (ref 39–117)
BUN: 10 mg/dL (ref 6–23)
CALCIUM: 9.3 mg/dL (ref 8.4–10.5)
CHLORIDE: 107 meq/L (ref 96–112)
CO2: 26 mEq/L (ref 19–32)
CREATININE: 0.96 mg/dL (ref 0.50–1.35)
Glucose, Bld: 97 mg/dL (ref 70–99)
Potassium: 4.4 mEq/L (ref 3.5–5.3)
Sodium: 142 mEq/L (ref 135–145)
TOTAL PROTEIN: 6.7 g/dL (ref 6.0–8.3)
Total Bilirubin: 0.4 mg/dL (ref 0.2–1.2)

## 2015-03-17 LAB — LIPID PANEL
CHOLESTEROL: 186 mg/dL (ref 0–200)
HDL: 119 mg/dL (ref >=40)
LDL CALC: 57 mg/dL (ref 0–99)
Total CHOL/HDL Ratio: 1.6 Ratio
Triglycerides: 50 mg/dL (ref <150)
VLDL: 10 mg/dL (ref 0–40)

## 2015-03-17 LAB — RPR

## 2015-03-17 LAB — HIV-1 RNA QUANT-NO REFLEX-BLD: HIV 1 RNA Quant: <20 copies/mL (ref <20)

## 2015-03-17 LAB — T-HELPER CELL (CD4) - (RCID CLINIC ONLY)
CD4 % Helper T Cell: 25 % — ABNORMAL LOW (ref 33–55)
CD4 T Cell Abs: 420 /uL (ref 400–2700)

## 2015-03-17 LAB — HEPATITIS C RNA QUANTITATIVE
HCV QUANT LOG: 6.71 {Log} — AB (ref <1.18)
HCV Quantitative: 5139811 IU/mL — ABNORMAL HIGH (ref <15)

## 2015-03-17 LAB — URINE CYTOLOGY ANCILLARY ONLY
CHLAMYDIA, DNA PROBE: NEGATIVE
NEISSERIA GONORRHEA: NEGATIVE

## 2015-03-18 LAB — HEPATITIS C GENOTYPE

## 2015-04-09 ENCOUNTER — Other Ambulatory Visit: Payer: Self-pay | Admitting: Infectious Diseases

## 2015-04-13 ENCOUNTER — Ambulatory Visit: Payer: Self-pay | Admitting: Infectious Diseases

## 2015-05-13 ENCOUNTER — Other Ambulatory Visit: Payer: Self-pay | Admitting: Infectious Diseases

## 2015-06-15 ENCOUNTER — Encounter: Payer: Self-pay | Admitting: Infectious Diseases

## 2015-06-15 ENCOUNTER — Ambulatory Visit (INDEPENDENT_AMBULATORY_CARE_PROVIDER_SITE_OTHER): Payer: Self-pay | Admitting: Infectious Diseases

## 2015-06-15 ENCOUNTER — Telehealth: Payer: Self-pay | Admitting: *Deleted

## 2015-06-15 VITALS — BP 142/99 | HR 79 | Temp 97.4°F | Wt 108.0 lb

## 2015-06-15 DIAGNOSIS — Z113 Encounter for screening for infections with a predominantly sexual mode of transmission: Secondary | ICD-10-CM

## 2015-06-15 DIAGNOSIS — Z79899 Other long term (current) drug therapy: Secondary | ICD-10-CM

## 2015-06-15 DIAGNOSIS — I10 Essential (primary) hypertension: Secondary | ICD-10-CM

## 2015-06-15 DIAGNOSIS — B182 Chronic viral hepatitis C: Secondary | ICD-10-CM

## 2015-06-15 DIAGNOSIS — B1920 Unspecified viral hepatitis C without hepatic coma: Secondary | ICD-10-CM | POA: Insufficient documentation

## 2015-06-15 DIAGNOSIS — F17200 Nicotine dependence, unspecified, uncomplicated: Secondary | ICD-10-CM

## 2015-06-15 DIAGNOSIS — Z23 Encounter for immunization: Secondary | ICD-10-CM

## 2015-06-15 DIAGNOSIS — B20 Human immunodeficiency virus [HIV] disease: Secondary | ICD-10-CM

## 2015-06-15 MED ORDER — ELBASVIR-GRAZOPREVIR 50-100 MG PO TABS: 1.0000 | ORAL_TABLET | Freq: Every day | ORAL | Status: DC

## 2015-06-15 MED ORDER — EMTRICITABINE-TENOFOVIR AF 200-25 MG PO TABS: 1.0000 | ORAL_TABLET | Freq: Every day | ORAL | Status: DC

## 2015-06-15 MED ORDER — DOLUTEGRAVIR SODIUM 50 MG PO TABS: 50.0000 mg | ORAL_TABLET | Freq: Every day | ORAL | Status: DC

## 2015-06-15 NOTE — Progress Notes (Signed)
   Subjective:    Patient ID: George Boyle, male    DOB: 10/18/1965, 49 y.o.   MRN: [id number]  HPI 49 yo M with HIV+ diagnosed in 2000. Was prev in [place] for care, on atripla.  Previous Hep B immune. Had anal pap 10-2013 that was (-).  Had Hep C screening at last blood- 1b Hepatitis C RNA quantitative Latest Ref Rng 03/16/2015  HCV Quantitative <15 IU/mL [id number](F)  HCV Quantitative Log <1.18 log 10 6.71(H)    HIV 1 RNA QUANT (copies/mL)  Date Value  03/16/2015 <20  06/02/2014 <20  10/14/2013 <20   CD4 T CELL ABS (/uL)  Date Value  03/16/2015 420  06/02/2014 520  10/14/2013 500   Feels well. Working as Programme researcher, broadcasting/film/video.  No problems with atripla.  occas cough- smokes.  Has 11 year old daughter.    Review of Systems  Constitutional: Negative for appetite change and unexpected weight change.  Respiratory: Positive for cough. Negative for shortness of breath.   Cardiovascular: Negative for chest pain.  Gastrointestinal: Negative for diarrhea and constipation.  Genitourinary: Negative for difficulty urinating.  Neurological: Negative for headaches.   Filed Vitals:   06/15/15 1040  BP: 142/99  Pulse: 79  Temp: 97.4 F (36.3 C)       Objective:   Physical Exam  Constitutional: He appears well-developed and well-nourished.  HENT:  Mouth/Throat: No oropharyngeal exudate.  Eyes: EOM are normal. Pupils are equal, round, and reactive to light.  Neck: Neck supple.  Cardiovascular: Normal rate, regular rhythm and normal heart sounds.   Pulmonary/Chest: Effort normal and breath sounds normal.  Abdominal: Soft. Bowel sounds are normal. There is no tenderness. There is no rebound.  Lymphadenopathy:    He has no cervical adenopathy.       Assessment & Plan:

## 2015-06-15 NOTE — Telephone Encounter (Signed)
Called the patient and advised of an appt for liver scan. Appointment set for 07/01/15 at 8 am. Advised nothing to eat after midnight and to arrive 15 min early. Also gave the telephone number to reschedule if he can not make the appt.

## 2015-06-15 NOTE — Assessment & Plan Note (Signed)
He is meeting with pharm to get him tx Will check elastogram.

## 2015-06-15 NOTE — Assessment & Plan Note (Addendum)
He is doing well.  Refuses condoms. Refuses dental (doesn't work with his schedule) Will cont on his current ART provided it does not interact with his Hep C rx (wil lneed to change to DTGV/Descovy)

## 2015-06-15 NOTE — Assessment & Plan Note (Signed)
Encouraged to quit. 

## 2015-06-15 NOTE — Assessment & Plan Note (Signed)
He is asx i repeated and got 145/95 He will check at pharmacies, f/u at next visit.

## 2015-07-01 ENCOUNTER — Ambulatory Visit (HOSPITAL_COMMUNITY): Payer: Self-pay

## 2015-07-01 ENCOUNTER — Encounter: Payer: Self-pay | Admitting: Pharmacy Technician

## 2015-07-06 ENCOUNTER — Telehealth: Payer: Self-pay | Admitting: Pharmacy Technician

## 2015-07-20 ENCOUNTER — Ambulatory Visit: Payer: Self-pay

## 2015-07-22 ENCOUNTER — Ambulatory Visit (HOSPITAL_COMMUNITY)
Admission: RE | Admit: 2015-07-22 | Discharge: 2015-07-22 | Disposition: A | Discharge status: Home / Self Care | Payer: Self-pay | Source: Ambulatory Visit | Attending: Infectious Diseases | Admitting: Infectious Diseases

## 2015-07-22 ENCOUNTER — Ambulatory Visit: Payer: Self-pay

## 2015-07-22 DIAGNOSIS — B182 Chronic viral hepatitis C: Secondary | ICD-10-CM | POA: Insufficient documentation

## 2015-07-27 ENCOUNTER — Ambulatory Visit: Payer: Self-pay

## 2015-09-23 ENCOUNTER — Telehealth: Payer: Self-pay | Admitting: Pharmacy Technician

## 2015-10-20 NOTE — Telephone Encounter (Signed)
Caren Griffins at [facility] called and let me know that Mr. Domer only got first bottle of Zepatier and did not refill any other bottles.  He started on 06/29/15.

## 2015-10-20 NOTE — Telephone Encounter (Signed)
Called to reschedule his appointment with [facility]

## 2015-11-16 ENCOUNTER — Other Ambulatory Visit: Payer: Self-pay

## 2016-03-09 ENCOUNTER — Encounter (HOSPITAL_COMMUNITY): Payer: Self-pay

## 2016-03-09 ENCOUNTER — Other Ambulatory Visit: Payer: Self-pay

## 2016-03-09 ENCOUNTER — Emergency Department (HOSPITAL_COMMUNITY)
Admission: EM | Admit: 2016-03-09 | Discharge: 2016-03-09 | Disposition: A | Discharge status: Home / Self Care | Payer: Self-pay | Attending: Emergency Medicine | Admitting: Emergency Medicine

## 2016-03-09 ENCOUNTER — Emergency Department (HOSPITAL_COMMUNITY): Payer: Self-pay

## 2016-03-09 DIAGNOSIS — R079 Chest pain, unspecified: Secondary | ICD-10-CM | POA: Insufficient documentation

## 2016-03-09 DIAGNOSIS — F17210 Nicotine dependence, cigarettes, uncomplicated: Secondary | ICD-10-CM | POA: Insufficient documentation

## 2016-03-09 DIAGNOSIS — Z79899 Other long term (current) drug therapy: Secondary | ICD-10-CM | POA: Insufficient documentation

## 2016-03-09 LAB — BASIC METABOLIC PANEL
Anion gap: 6 (ref 5–15)
BUN: 9 mg/dL (ref 6–20)
CHLORIDE: 108 mmol/L (ref 101–111)
CO2: 25 mmol/L (ref 22–32)
CREATININE: 0.88 mg/dL (ref 0.61–1.24)
Calcium: 9.4 mg/dL (ref 8.9–10.3)
GFR calc non Af Amer: >60 mL/min (ref >60)
GLUCOSE: 90 mg/dL (ref 65–99)
Potassium: 4.9 mmol/L (ref 3.5–5.1)
Sodium: 139 mmol/L (ref 135–145)

## 2016-03-09 LAB — I-STAT TROPONIN, ED: TROPONIN I, POC: 0.01 ng/mL (ref 0.00–0.08)

## 2016-03-09 LAB — CBC
HCT: 43.8 % (ref 39.0–52.0)
Hemoglobin: 14.8 g/dL (ref 13.0–17.0)
MCH: 33.6 pg (ref 26.0–34.0)
MCHC: 33.8 g/dL (ref 30.0–36.0)
MCV: 99.3 fL (ref 78.0–100.0)
PLATELETS: 269 10*3/uL (ref 150–400)
RBC: 4.41 MIL/uL (ref 4.22–5.81)
RDW: 12.8 % (ref 11.5–15.5)
WBC: 8.5 10*3/uL (ref 4.0–10.5)

## 2016-03-09 LAB — D-DIMER, QUANTITATIVE (NOT AT ARMC): D DIMER QUANT: 0.52 ug{FEU}/mL — AB (ref 0.00–0.50)

## 2016-03-09 MED ORDER — ALBUTEROL SULFATE HFA 108 (90 BASE) MCG/ACT IN AERS: 2.0000 | INHALATION_SPRAY | Freq: Four times a day (QID) | RESPIRATORY_TRACT | Status: DC | PRN

## 2016-03-09 MED ORDER — ALBUTEROL SULFATE HFA 108 (90 BASE) MCG/ACT IN AERS
2.0000 | INHALATION_SPRAY | Freq: Once | RESPIRATORY_TRACT | Status: AC
  Administered 2016-03-09: 2 via RESPIRATORY_TRACT
  Filled 2016-03-09: qty 6.7

## 2016-03-09 MED ORDER — PREDNISONE 20 MG PO TABS: ORAL_TABLET | ORAL | Status: DC

## 2016-03-09 MED ORDER — PREDNISONE 20 MG PO TABS
40.0000 mg | ORAL_TABLET | Freq: Once | ORAL | Status: AC
  Administered 2016-03-09: 40 mg via ORAL
  Filled 2016-03-09: qty 2

## 2016-03-09 MED ORDER — PREDNISONE 20 MG PO TABS: 40.0000 mg | ORAL_TABLET | Freq: Once | ORAL | Status: DC

## 2016-03-09 MED ORDER — KETOROLAC TROMETHAMINE 60 MG/2ML IM SOLN
60.0000 mg | Freq: Once | INTRAMUSCULAR | Status: AC
Start: 2016-03-09 — End: 2016-03-09
  Administered 2016-03-09: 60 mg via INTRAMUSCULAR
  Filled 2016-03-09: qty 2

## 2016-03-09 NOTE — ED Notes (Signed)
Patient complains of intermittent right sided sharp chest pain x 2 days, some shortness of breath with same during inspiration. Reports increased cough with same. NAD

## 2016-03-09 NOTE — ED Notes (Signed)
Pt. Anxious to leave. Refusing CT angio Chest and verbalizing understanding of risks after speaking with the provider.

## 2016-03-09 NOTE — ED Notes (Signed)
MD Mesner made aware he wishes to speak with him about going home. Pt states he doesn't wish to have a CT scan. MD at bedside to explain benefits or staying and risks of leaving.

## 2016-03-09 NOTE — ED Notes (Signed)
Patient transported to Xray, will be taken to room after

## 2016-03-09 NOTE — ED Notes (Signed)
D/C instructions reviewed with pt and pt. Verbalizes understanding and denies any questions. Pt. Verbalizes understanding of refusing the CT angio. Pt. Leaving ambulatory with a friend

## 2016-03-09 NOTE — ED Provider Notes (Signed)
CSN: [id number]     Arrival date & time 03/09/16  1217 History   First MD Initiated Contact with Patient 03/09/16 1305     Chief Complaint  Patient presents with  . Chest Pain     (Consider location/radiation/quality/duration/timing/severity/associated sxs/prior Treatment) Patient is a 50 y.o. male presenting with chest pain.  Chest Pain Pain location:  R chest and R lateral chest Pain quality: aching and sharp   Pain radiates to:  Does not radiate Pain radiates to the back: no   Pain severity:  Mild Onset quality:  Gradual Duration:  3 days Timing:  Intermittent Progression:  Waxing and waning Associated symptoms: cough and shortness of breath   Associated symptoms: no fever     History reviewed. No pertinent past medical history. History reviewed. No pertinent past surgical history. No family history on file. Social History  Substance Use Topics  . Smoking status: Current Every Day Smoker -- 0.50 packs/day for 34 years    Types: Cigarettes  . Smokeless tobacco: Never Used  . Alcohol Use: 5.4 oz/week    9 Cans of beer per week     Comment: beer    Review of Systems  Constitutional: Negative for fever.  Respiratory: Positive for cough and shortness of breath.   Cardiovascular: Positive for chest pain.  All other systems reviewed and are negative.     Allergies  Review of patient's allergies indicates no known allergies.  Home Medications   Prior to Admission medications   Medication Sig Start Date End Date Taking? Authorizing Provider  dolutegravir (TIVICAY) 50 MG tablet Take 1 tablet (50 mg total) by mouth daily. 06/15/15  Yes Campbell Riches, MD  emtricitabine-tenofovir AF (DESCOVY) 200-25 MG tablet Take 1 tablet by mouth daily. 06/15/15  Yes Campbell Riches, MD  ibuprofen (ADVIL,MOTRIN) 200 MG tablet Take 800 mg by mouth every 6 (six) hours as needed for mild pain.   Yes Historical Provider, MD  albuterol (PROVENTIL HFA;VENTOLIN HFA) 108 (90 Base)  MCG/ACT inhaler Inhale 2 puffs into the lungs every 6 (six) hours as needed for wheezing or shortness of breath. 03/09/16   Merrily Pew, MD  predniSONE (DELTASONE) 20 MG tablet Take 2 tablets (40 mg total) by mouth once. 03/10/16   Merrily Pew, MD  predniSONE (DELTASONE) 20 MG tablet 2 tabs po daily x 4 days 03/10/16   Merrily Pew, MD   BP 130/95 mmHg  Pulse 77  Temp(Src) 98.2 F (36.8 C) (Oral)  Resp 20  Ht 5\' 7"  (1.702 m)  Wt 113 lb (51.256 kg)  BMI 17.69 kg/m2  SpO2 100% Physical Exam  Constitutional: He is oriented to person, place, and time. He appears well-developed and well-nourished.  HENT:  Head: Normocephalic and atraumatic.  Neck: Normal range of motion.  Cardiovascular: Normal rate.   Pulmonary/Chest: Effort normal. No respiratory distress. He exhibits tenderness.  Abdominal: Soft. He exhibits no distension.  Musculoskeletal: Normal range of motion.  Neurological: He is alert and oriented to person, place, and time.  Skin: Skin is warm and dry.  Nursing note and vitals reviewed.   ED Course  Procedures (including critical care time) Labs Review Labs Reviewed  D-DIMER, QUANTITATIVE (NOT AT [facility]) - Abnormal; Notable for the following:    D-Dimer, Quant 0.52 (*)    All other components within normal limits  BASIC METABOLIC PANEL  CBC  I-STAT TROPOININ, ED    Imaging Review Dg Chest 2 View  03/09/2016  CLINICAL DATA:  Right-sided chest pain  for 3 days. EXAM: CHEST  2 VIEW COMPARISON:  None. FINDINGS: The heart size and mediastinal contours are within normal limits. Both lungs are clear. The visualized skeletal structures are unremarkable. IMPRESSION: No active cardiopulmonary disease. Electronically Signed   By: Monte Fantasia M.D.   On: 03/09/2016 13:08   I have personally reviewed and evaluated these images and lab results as part of my medical decision-making.   EKG Interpretation   Date/Time:  Wednesday March 09 2016 12:20:30 EDT Ventricular Rate:  88 PR  Interval:  116 QRS Duration: 74 QT Interval:  374 QTC Calculation: 452 R Axis:   97 Text Interpretation:   Poor data quality, interpretation may be  adversely affected Normal sinus rhythm Rightward axis Borderline ECG  Confirmed by Kassondra Geil MD, Corene Cornea [phone]) on 03/09/2016 1:47:56 PM      MDM   Final diagnoses:  Chest pain, unspecified chest pain type   50 year old male here with chest pain likely bronchitis. Did have elevated d-dimer however patient refused to get a CT scan to evaluate for pulmonary embolus. In terms improved with oral in the ED, albuterol and steroids. Chest x-ray without any evidence of pneumonia. Recommended CT scan the patient was adamant that he did not want it and I didn't feel this was likely so was not made to sign out AMA, but knows that he can come back for ct scan if symptoms don't improve.   New Prescriptions: Discharge Medication List as of 03/09/2016  4:30 PM    START taking these medications   Details  albuterol (PROVENTIL HFA;VENTOLIN HFA) 108 (90 Base) MCG/ACT inhaler Inhale 2 puffs into the lungs every 6 (six) hours as needed for wheezing or shortness of breath., Starting 03/09/2016, Until Discontinued, Print    !! predniSONE (DELTASONE) 20 MG tablet Take 2 tablets (40 mg total) by mouth once., Starting 03/10/2016, Print    !! predniSONE (DELTASONE) 20 MG tablet 2 tabs po daily x 4 days, Print     !! - Potential duplicate medications found. Please discuss with provider.       I have personally and contemperaneously reviewed labs and imaging and used in my decision making as above.   A medical screening exam was performed and I feel the patient has had an appropriate workup for their chief complaint at this time and likelihood of emergent condition existing is low and thus workup can continue on an outpatient basis.. Their vital signs are stable. They have been counseled on decision, discharge, follow up and which symptoms necessitate immediate return to  the emergency department.  They verbally stated understanding and agreement with plan and discharged in stable condition.   Merrily Pew, MD 03/09/16 [phone]

## 2016-04-06 ENCOUNTER — Emergency Department (HOSPITAL_COMMUNITY)
Admission: EM | Admit: 2016-04-06 | Discharge: 2016-04-06 | Disposition: A | Discharge status: Home / Self Care | Payer: Self-pay | Attending: Emergency Medicine | Admitting: Emergency Medicine

## 2016-04-06 ENCOUNTER — Encounter (HOSPITAL_COMMUNITY): Payer: Self-pay | Admitting: *Deleted

## 2016-04-06 DIAGNOSIS — L0291 Cutaneous abscess, unspecified: Secondary | ICD-10-CM

## 2016-04-06 DIAGNOSIS — K610 Anal abscess: Secondary | ICD-10-CM | POA: Insufficient documentation

## 2016-04-06 DIAGNOSIS — F17210 Nicotine dependence, cigarettes, uncomplicated: Secondary | ICD-10-CM | POA: Insufficient documentation

## 2016-04-06 DIAGNOSIS — I10 Essential (primary) hypertension: Secondary | ICD-10-CM | POA: Insufficient documentation

## 2016-04-06 HISTORY — DX: Human immunodeficiency virus (HIV) disease: B20

## 2016-04-06 LAB — CBC
HEMATOCRIT: 46 % (ref 39.0–52.0)
Hemoglobin: 15.2 g/dL (ref 13.0–17.0)
MCH: 33.1 pg (ref 26.0–34.0)
MCHC: 33 g/dL (ref 30.0–36.0)
MCV: 100.2 fL — AB (ref 78.0–100.0)
PLATELETS: 241 10*3/uL (ref 150–400)
RBC: 4.59 MIL/uL (ref 4.22–5.81)
RDW: 13.2 % (ref 11.5–15.5)
WBC: 12.3 10*3/uL — ABNORMAL HIGH (ref 4.0–10.5)

## 2016-04-06 MED ORDER — LIDOCAINE-EPINEPHRINE (PF) 2 %-1:200000 IJ SOLN
20.0000 mL | Freq: Once | INTRAMUSCULAR | Status: AC
  Administered 2016-04-06: 20 mL
  Filled 2016-04-06: qty 20

## 2016-04-06 NOTE — ED Notes (Signed)
I&D tray and supplies at the bedside.

## 2016-04-06 NOTE — ED Notes (Signed)
MD at bedside with ultrasound

## 2016-04-06 NOTE — ED Notes (Signed)
MD successfully completed I&D procedure.

## 2016-04-06 NOTE — ED Triage Notes (Signed)
Pt reports having rectal abscess for several days. Denies fever.

## 2016-04-06 NOTE — ED Notes (Signed)
Patient reports abscess L rectum since Sunday, states he had a very similar one 2-3 years ago that had to be lanced. Denies straining, no fevers/chills.

## 2016-04-06 NOTE — ED Notes (Signed)
Patient verbalized understanding of discharge instructions and denies any further needs or questions at this time. VS stable. Patient ambulatory with steady gait.  

## 2016-04-06 NOTE — ED Provider Notes (Signed)
[facility] DEPT Provider Note   CSN: [id number] Arrival date & time: 04/06/16  1042  First Provider Contact:  None       History   Chief Complaint Chief Complaint  Patient presents with  . Abscess    HPI George Boyle is a 50 y.o. male.  HPI  50 y.o male hiv positive on antiretroviral therapy reports normal counts presents complaining of perianal abscess with swelling beginning 2 days ago.  He denies fever, chills, abdominal pain, pain with bowel movements.  He states he has been using soaks to try to get it to drain ithout success.  He reports a similar episode 2 years ago in [place] which was able to be drained in the ed.   Past Medical History:  Diagnosis Date  . HIV disease [facility])     Patient Active Problem List   Diagnosis Date Noted  . Hepatitis C without hepatic coma 06/15/2015  . Essential hypertension, benign 06/15/2015  . Hemorrhoid 10/28/2013  . TOBACCO ABUSE 11/15/2010  . HIV disease ([place]) 06/07/2010    History reviewed. No pertinent surgical history.     Home Medications    Prior to Admission medications   Medication Sig Start Date End Date Taking? Authorizing Provider  albuterol (PROVENTIL HFA;VENTOLIN HFA) 108 (90 Base) MCG/ACT inhaler Inhale 2 puffs into the lungs every 6 (six) hours as needed for wheezing or shortness of breath. 03/09/16   Merrily Pew, MD  dolutegravir (TIVICAY) 50 MG tablet Take 1 tablet (50 mg total) by mouth daily. 06/15/15   Campbell Riches, MD  emtricitabine-tenofovir AF (DESCOVY) 200-25 MG tablet Take 1 tablet by mouth daily. 06/15/15   Campbell Riches, MD  ibuprofen (ADVIL,MOTRIN) 200 MG tablet Take 800 mg by mouth every 6 (six) hours as needed for mild pain.    Historical Provider, MD  predniSONE (DELTASONE) 20 MG tablet Take 2 tablets (40 mg total) by mouth once. 03/10/16   Merrily Pew, MD  predniSONE (DELTASONE) 20 MG tablet 2 tabs po daily x 4 days 03/10/16   Merrily Pew, MD    Family History History  reviewed. No pertinent family history.  Social History Social History  Substance Use Topics  . Smoking status: Current Every Day Smoker    Packs/day: 0.50    Years: 34.00    Types: Cigarettes  . Smokeless tobacco: Never Used  . Alcohol use 5.4 oz/week    9 Cans of beer per week     Comment: beer     Allergies   Review of patient's allergies indicates no known allergies.   Review of Systems Review of Systems  All other systems reviewed and are negative.    Physical Exam Updated Vital Signs BP 146/96   Pulse 77   Temp 97.9 F (36.6 C) (Oral)   Resp 18   Ht 5\' 7"  (1.702 m)   Wt 51.3 kg   SpO2 98%   BMI 17.70 kg/m   Physical Exam  Constitutional: He is oriented to person, place, and time. He appears well-developed and well-nourished.  HENT:  Head: Normocephalic and atraumatic.  Right Ear: External ear normal.  Left Ear: External ear normal.  Nose: Nose normal.  Eyes: EOM are normal.  Neck: No tracheal deviation present.  Cardiovascular: Normal rate and regular rhythm.   Pulmonary/Chest: Effort normal.  Abdominal: Soft. Bowel sounds are normal.  Genitourinary:  Genitourinary Comments: Tender fluctuant area to right of anus, rectal exam performed with no internal tenderness or fluctuance noted.  Musculoskeletal: Normal range of motion.  Neurological: He is alert and oriented to person, place, and time.  Skin: Skin is warm and dry. Capillary refill takes less than 2 seconds.  Psychiatric: He has a normal mood and affect. His behavior is normal.  Nursing note and vitals reviewed.    ED Treatments / Results  Labs (all labs ordered are listed, but only abnormal results are displayed) Labs Reviewed  CBC    EKG  EKG Interpretation None       Radiology No results found.  Procedures [place] bedside Date/Time: 04/06/2016 4:28 PM Performed by: Pattricia Boss Authorized by: Pattricia Boss  Consent: Verbal consent obtained. Risks and benefits: risks, benefits  and alternatives were discussed Consent given by: patient Local anesthesia used: no  Anesthesia: Local anesthesia used: no  Sedation: Patient sedated: no Patient tolerance: Patient tolerated the procedure well with no immediate complications Comments: EMERGENCY DEPARTMENT US SOFT TISSUE INTERPRETATION "Study: Limited Soft Tissue Ultrasound"  INDICATIONS: Soft tissue infection Multiple views of the body part were obtained in real-time with a multi-frequency linear probe PERFORMED BY:  Myself IMAGES ARCHIVED?: Yes SIDE:Right  BODY PART:Neck and Other soft tisse (comment in note) FINDINGS: Abcess present INTERPRETATION:  Abcess present   .Marland KitchenIncision and Drainage Date/Time: 04/06/2016 4:30 PM Performed by: Pattricia Boss Authorized by: Pattricia Boss   Consent:    Consent obtained:  Verbal   Consent given by:  Patient   Risks discussed:  Infection   Alternatives discussed:  Alternative treatment Location:    Type:  Abscess   Size:  2 cm   Location:  Anogenital   Anogenital location:  Perirectal   (including critical care time)  Medications Ordered in ED Medications  lidocaine-EPINEPHrine (XYLOCAINE W/EPI) 2 %-1:200000 (PF) injection 20 mL (20 mLs Infiltration Given by Other 04/06/16 1559)     Initial Impression / Assessment and Plan / ED Course  I have reviewed the triage vital signs and the nursing notes.  Pertinent labs & imaging results that were available during my care of the patient were reviewed by me and considered in my medical decision making (see chart for details).  Clinical Course    Patient with perianal abscess- no evidence of extension on exam, ultrasound or during I and d.  Patient cautioned regarding return precautions and voices understanding.   Final Clinical Impressions(s) / ED Diagnoses   Final diagnoses:  Abscess    New Prescriptions New Prescriptions   No medications on file     Pattricia Boss, MD 04/06/16 1752

## 2016-04-06 NOTE — ED Notes (Signed)
MD at bedside. 

## 2016-04-11 ENCOUNTER — Other Ambulatory Visit (INDEPENDENT_AMBULATORY_CARE_PROVIDER_SITE_OTHER): Payer: Self-pay

## 2016-04-11 DIAGNOSIS — B20 Human immunodeficiency virus [HIV] disease: Secondary | ICD-10-CM

## 2016-04-11 DIAGNOSIS — Z79899 Other long term (current) drug therapy: Secondary | ICD-10-CM

## 2016-04-11 DIAGNOSIS — Z113 Encounter for screening for infections with a predominantly sexual mode of transmission: Secondary | ICD-10-CM

## 2016-04-11 LAB — COMPREHENSIVE METABOLIC PANEL
ALBUMIN: 3.7 g/dL (ref 3.6–5.1)
ALK PHOS: 89 U/L (ref 40–115)
ALT: 16 U/L (ref 9–46)
AST: 21 U/L (ref 10–40)
BILIRUBIN TOTAL: 0.4 mg/dL (ref 0.2–1.2)
BUN: 11 mg/dL (ref 7–25)
CHLORIDE: 103 mmol/L (ref 98–110)
CO2: 24 mmol/L (ref 20–31)
CREATININE: 0.97 mg/dL (ref 0.60–1.35)
Calcium: 9.4 mg/dL (ref 8.6–10.3)
Glucose, Bld: 83 mg/dL (ref 65–99)
POTASSIUM: 4.4 mmol/L (ref 3.5–5.3)
SODIUM: 139 mmol/L (ref 135–146)
TOTAL PROTEIN: 6.9 g/dL (ref 6.1–8.1)

## 2016-04-11 LAB — CBC
HCT: 47 % (ref 38.5–50.0)
HEMOGLOBIN: 16 g/dL (ref 13.2–17.1)
MCH: 33.8 pg — AB (ref 27.0–33.0)
MCHC: 34 g/dL (ref 32.0–36.0)
MCV: 99.4 fL (ref 80.0–100.0)
MPV: 9.6 fL (ref 7.5–12.5)
Platelets: 329 10*3/uL (ref 140–400)
RBC: 4.73 MIL/uL (ref 4.20–5.80)
RDW: 13.5 % (ref 11.0–15.0)
WBC: 8 10*3/uL (ref 3.8–10.8)

## 2016-04-11 LAB — LIPID PANEL
CHOLESTEROL: 165 mg/dL (ref 125–200)
HDL: 84 mg/dL (ref >=40)
LDL Cholesterol: 65 mg/dL (ref <130)
TRIGLYCERIDES: 79 mg/dL (ref <150)
Total CHOL/HDL Ratio: 2 Ratio (ref <=5.0)
VLDL: 16 mg/dL (ref <30)

## 2016-04-12 LAB — FLUORESCENT TREPONEMAL AB(FTA)-IGG-BLD: FLUORESCENT TREPONEMAL ABS: REACTIVE — AB

## 2016-04-12 LAB — T-HELPER CELL (CD4) - (RCID CLINIC ONLY)
CD4 T CELL ABS: 600 /uL (ref 400–2700)
CD4 T CELL HELPER: 25 % — AB (ref 33–55)

## 2016-04-12 LAB — HIV-1 RNA QUANT-NO REFLEX-BLD
HIV 1 RNA QUANT: 57 {copies}/mL — AB (ref <20)
HIV-1 RNA QUANT, LOG: 1.76 {Log_copies}/mL — AB (ref <1.30)

## 2016-04-12 LAB — RPR TITER: RPR Titer: 1:1 {titer}

## 2016-04-12 LAB — RPR: RPR Ser Ql: REACTIVE — AB

## 2016-04-25 ENCOUNTER — Ambulatory Visit (INDEPENDENT_AMBULATORY_CARE_PROVIDER_SITE_OTHER): Payer: Self-pay | Admitting: Infectious Diseases

## 2016-04-25 ENCOUNTER — Encounter: Payer: Self-pay | Admitting: Infectious Diseases

## 2016-04-25 VITALS — Temp 98.3°F | Wt 109.0 lb

## 2016-04-25 DIAGNOSIS — B20 Human immunodeficiency virus [HIV] disease: Secondary | ICD-10-CM

## 2016-04-25 DIAGNOSIS — Z79899 Other long term (current) drug therapy: Secondary | ICD-10-CM

## 2016-04-25 DIAGNOSIS — Z113 Encounter for screening for infections with a predominantly sexual mode of transmission: Secondary | ICD-10-CM

## 2016-04-25 MED ORDER — EFAVIRENZ-EMTRICITAB-TENOFOVIR 600-200-300 MG PO TABS
1.0000 | ORAL_TABLET | Freq: Every day | ORAL | 11 refills | Status: DC
Start: 2016-04-25 — End: 2017-05-04

## 2016-04-25 NOTE — Progress Notes (Signed)
   Subjective:    Patient ID: George Boyle, male    DOB: [birth year], 50 y.o.   MRN: [id number]  HPI 50 yo M with HIV+ diagnosed in 2000. Was prev in [place] for care, on atripla.  Previous Hep B immune. Had anal pap 10-2013 that was (-).  Had Hep C screening at last blood- 1b Has been on therapy for Hep C. Completed 3 months of treatment.  Would like to go back to prev ART.   Was seen in ED twice since last visit- rectal abscess and CAP.  Still has kont at site of prev abscess.   HIV 1 RNA Quant (copies/mL)  Date Value  04/11/2016 57 (H)  03/16/2015 <20  06/02/2014 <20   CD4 T Cell Abs (/uL)  Date Value  04/11/2016 600  03/16/2015 420  06/02/2014 520    Review of Systems  Constitutional: Negative for appetite change and unexpected weight change.  Respiratory: Negative for cough and shortness of breath.   Gastrointestinal: Negative for constipation and diarrhea.  Genitourinary: Negative for difficulty urinating.       Objective:   Physical Exam  Constitutional: He appears well-developed and well-nourished.  HENT:  Mouth/Throat: No oropharyngeal exudate.  Eyes: EOM are normal. Pupils are equal, round, and reactive to light.  Neck: Neck supple.  Cardiovascular: Normal rate, regular rhythm and normal heart sounds.   Pulmonary/Chest: Effort normal and breath sounds normal.  Abdominal: Soft. Bowel sounds are normal. There is no tenderness. There is no rebound.  Musculoskeletal: He exhibits no edema.  Lymphadenopathy:    He has no cervical adenopathy.       Assessment & Plan:

## 2016-04-25 NOTE — Assessment & Plan Note (Signed)
Encouraged to quit. 

## 2016-04-25 NOTE — Assessment & Plan Note (Signed)
Will change his art back to atripla at his request.  Gets flu shot today Given condoms rtc in 6 months.

## 2016-04-25 NOTE — Assessment & Plan Note (Signed)
Will recheck his VL

## 2016-04-26 LAB — HEPATITIS C RNA QUANTITATIVE: HCV QUANT: NOT DETECTED [IU]/mL (ref <15)

## 2016-04-29 IMAGING — US US ABDOMEN COMPLETE W/ ELASTOGRAPHY
1 series · 13 of 13 positions shown · non-contrast
Comparison: None.

CLINICAL DATA: Chronic hepatitis C



[Series 1: us abdomen complete w/ elastography · 0.12mm/px · 13 of 13 slices shown]
[im 1/13]
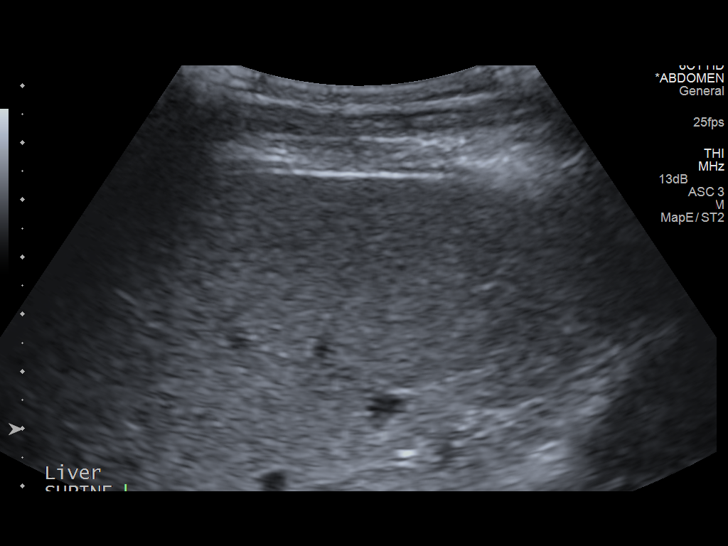
[im 2/13]
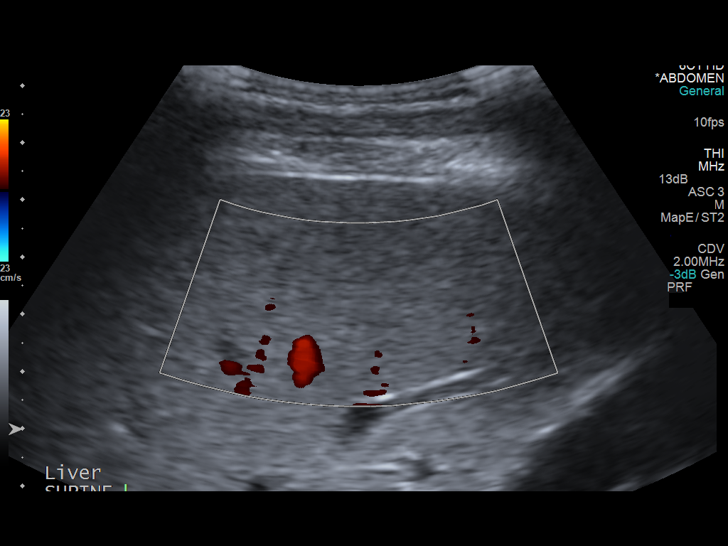
[im 3/13]
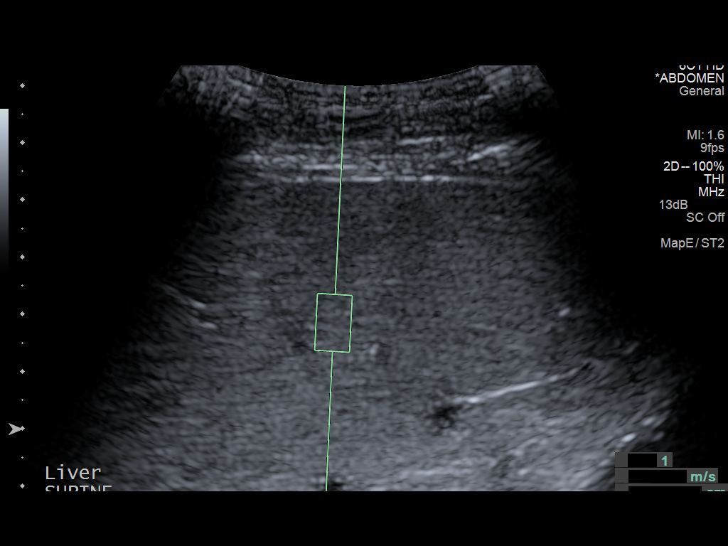
[im 4/13]
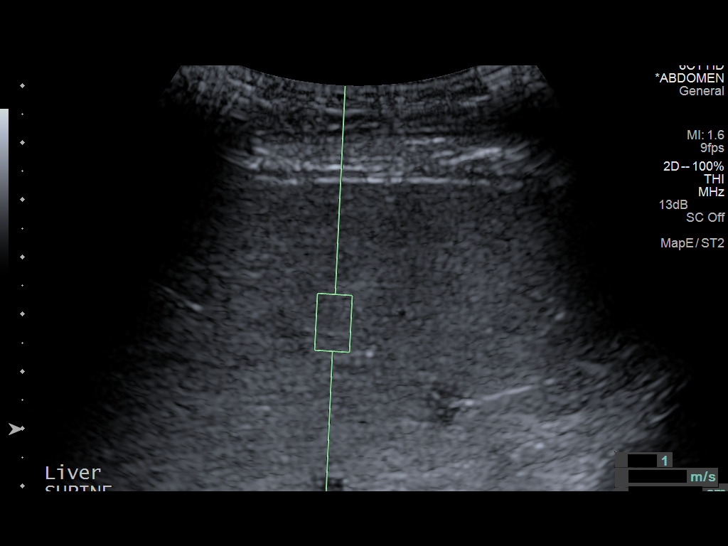
[im 5/13]
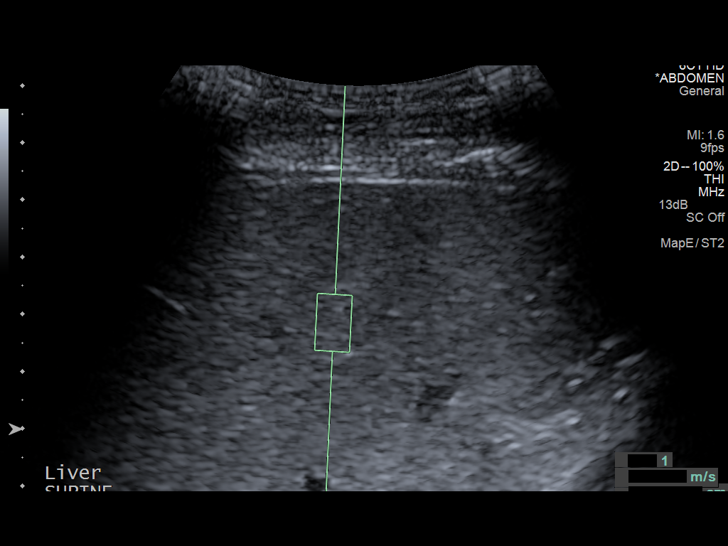
[im 6/13]
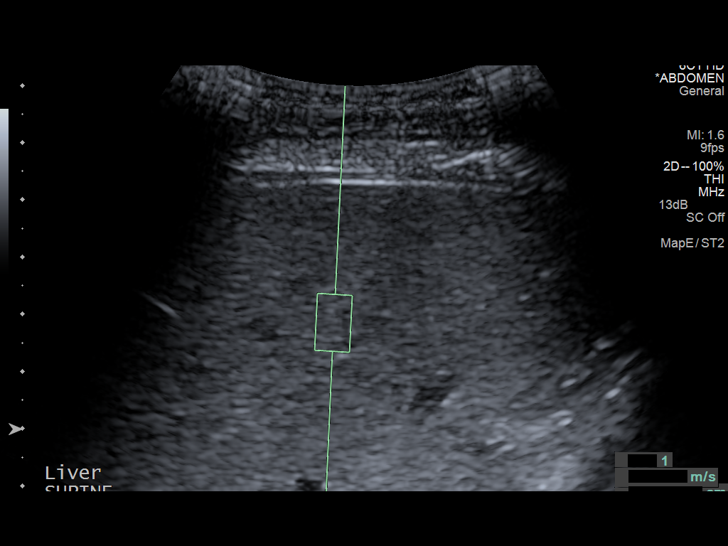
[im 7/13]
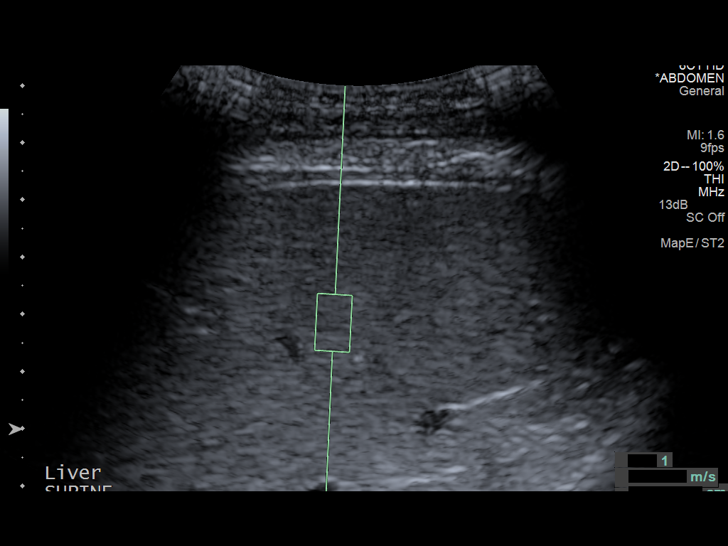
[im 8/13]
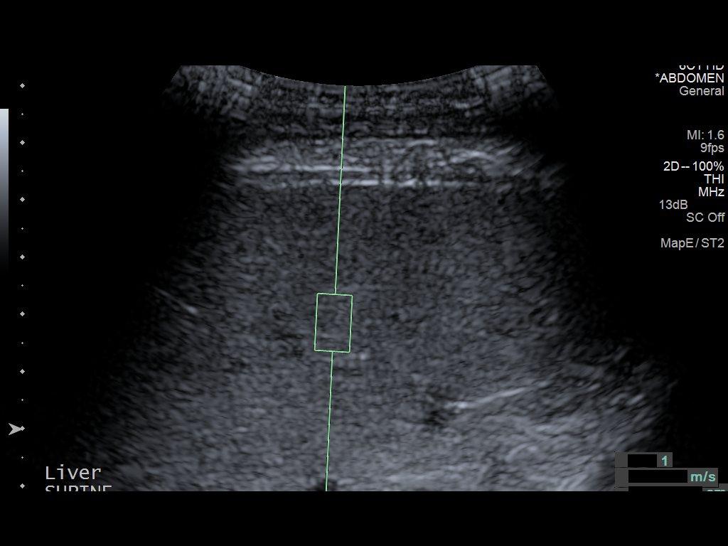
[im 9/13]
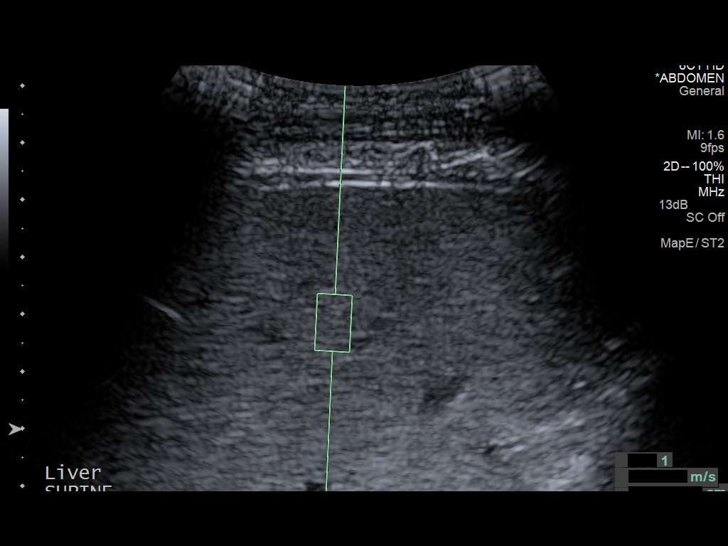
[im 10/13]
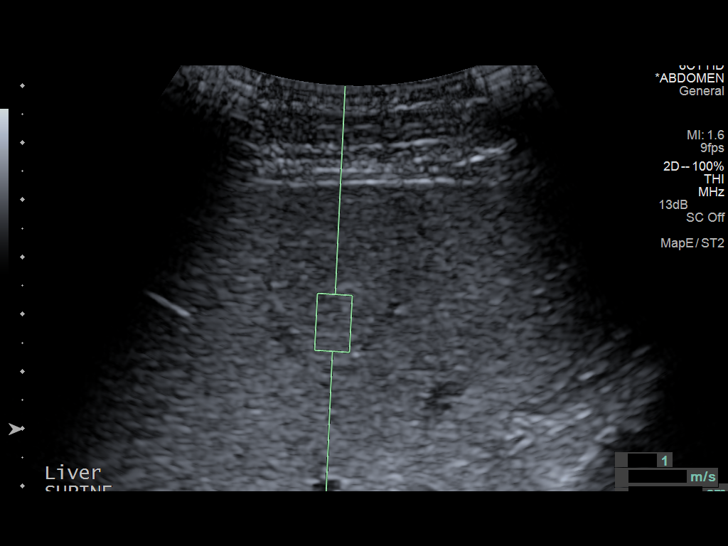
[im 11/13]
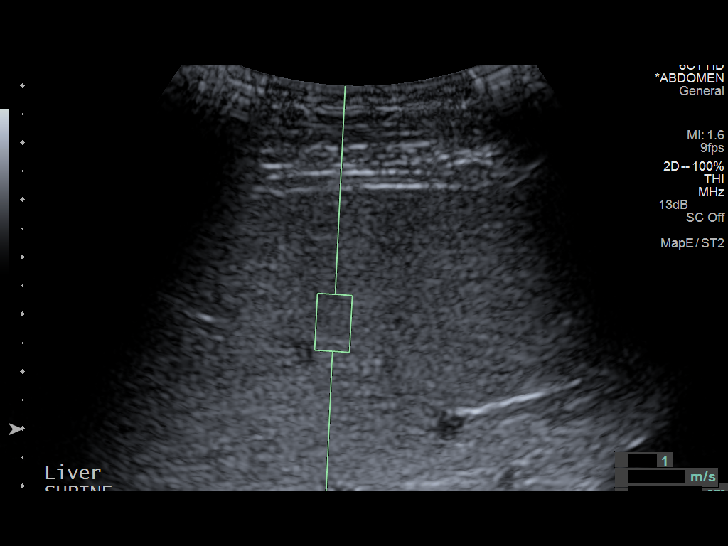
[im 12/13]
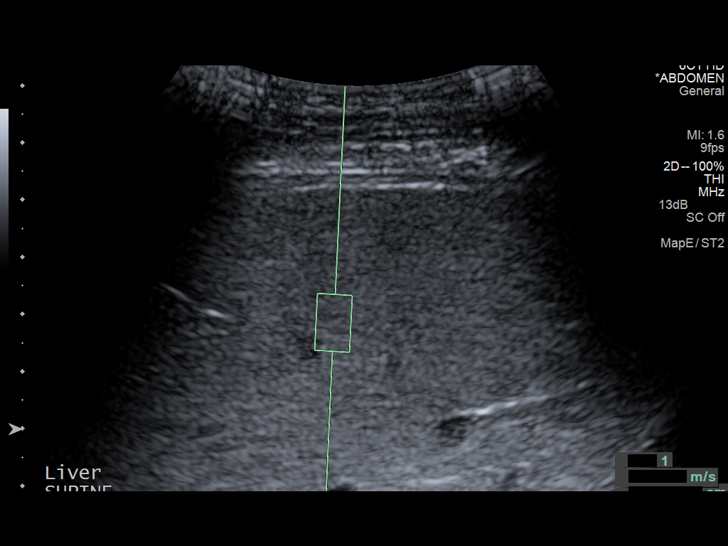
[im 13/13]
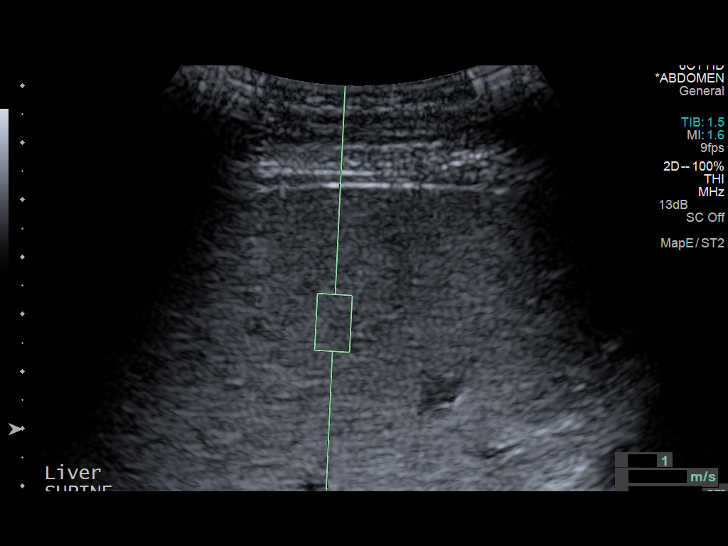

[13 of 13 positions shown; findings below may reference images not displayed]

FINDINGS: ULTRASOUND ABDOMEN

Gallbladder: No gallstones, gallbladder wall thickening, or
pericholecystic fluid. Negative sonographic Murphy's sign.

Common bile duct: Diameter: 3 mm

Liver: No focal lesion identified. Within normal limits in
parenchymal echogenicity.

IVC: No abnormality visualized.

Pancreas: Visualized portion unremarkable.

Spleen: Size and appearance within normal limits.

Right Kidney: Length: 10.7 cm. No mass or hydronephrosis.

Left Kidney: Length: 11.1 cm. No mass or hydronephrosis.

Abdominal aorta: No aneurysm visualized.

Other findings: None.

ULTRASOUND HEPATIC ELASTOGRAPHY

Device: Siemens Helix VTQ

Transducer 6C1

Patient position: Supine

Number of measurements:  10

Hepatic Segment:  8

Median velocity:   1.20  m/sec

IQR:

IQR/Median velocity ratio

Corresponding Metavir fibrosis score:  F2/F3

Risk of fibrosis: Moderate

Limitations of exam: None

Pertinent findings noted on other imaging exams:  None

Please note that abnormal shear wave velocities may also be
identified in clinical settings other than with hepatic fibrosis,
such as: acute hepatitis, elevated right heart and central venous
pressures including use of beta blockers, Linh disease
(Riheme), infiltrative processes such as
mastocytosis/amyloidosis/infiltrative tumor, extrahepatic
cholestasis, in the post-prandial state, and liver transplantation.
Correlation with patient history, laboratory data, and clinical
condition recommended.
IMPRESSION: Unremarkable abdominal ultrasound.

Median hepatic shear wave velocity is calculated at 1.20 m/sec.

Corresponding Metavir fibrosis score is F2/F3.

Risk of fibrosis is moderate.

Follow-up:  Additional testing appropriate.

## 2016-04-29 IMAGING — US US ABDOMEN COMPLETE W/ ELASTOGRAPHY
1 series · 13 of 25 positions shown · non-contrast
Comparison: None.

CLINICAL DATA: Chronic hepatitis C



[Series 1: us abdomen complete w/ elastography · 0.14mm/px · 13 of 82 slices shown]
[im 1/82]
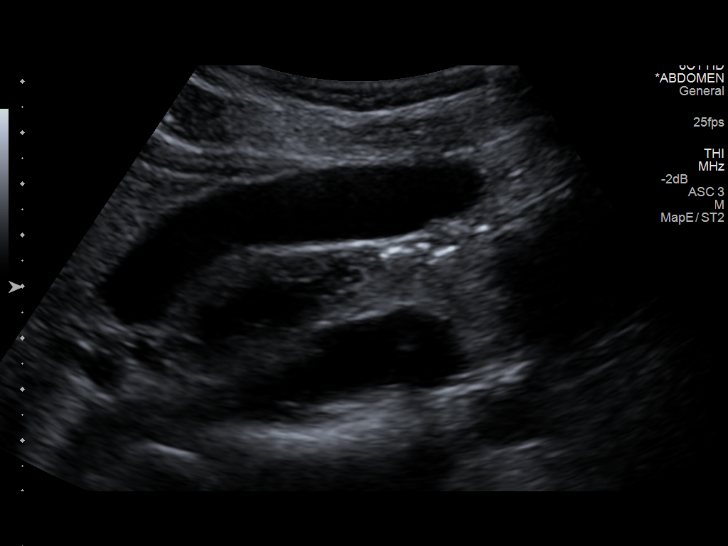
[im 7/82]
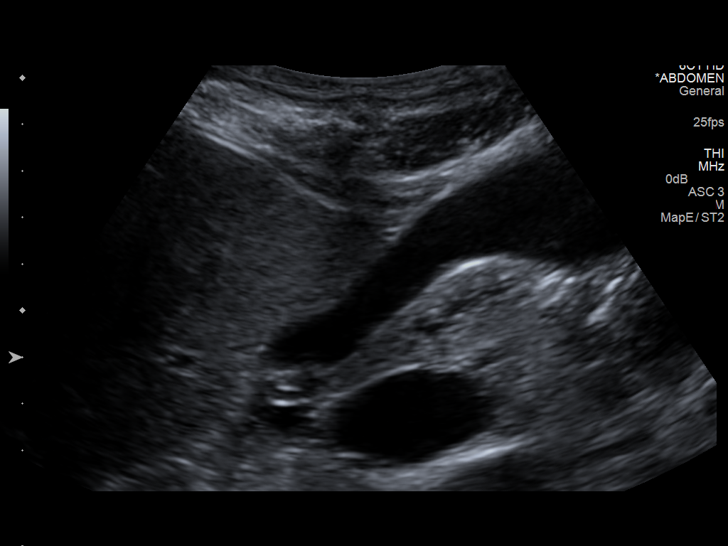
[im 14/82]
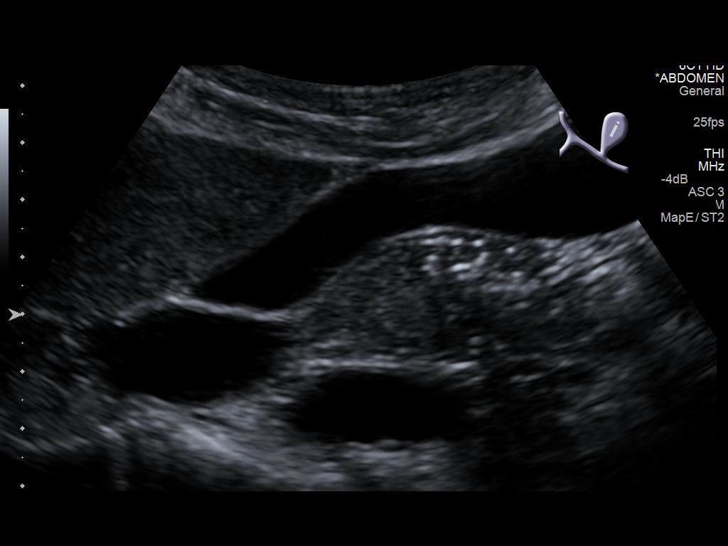
[im 21/82]
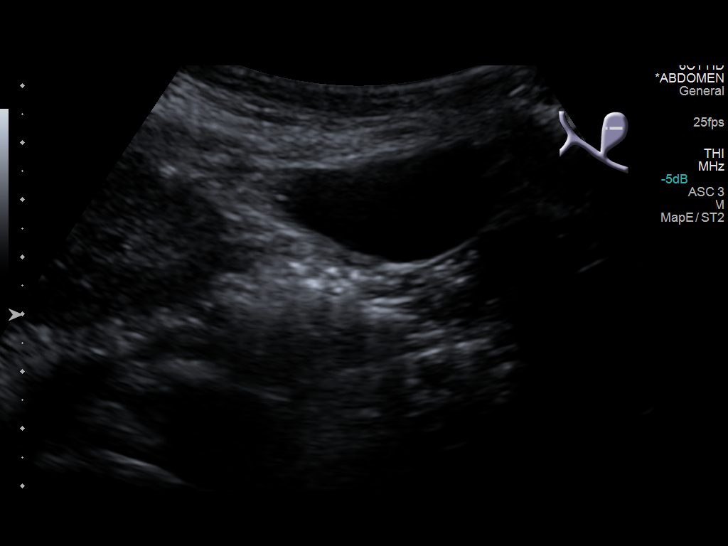
[im 28/82]
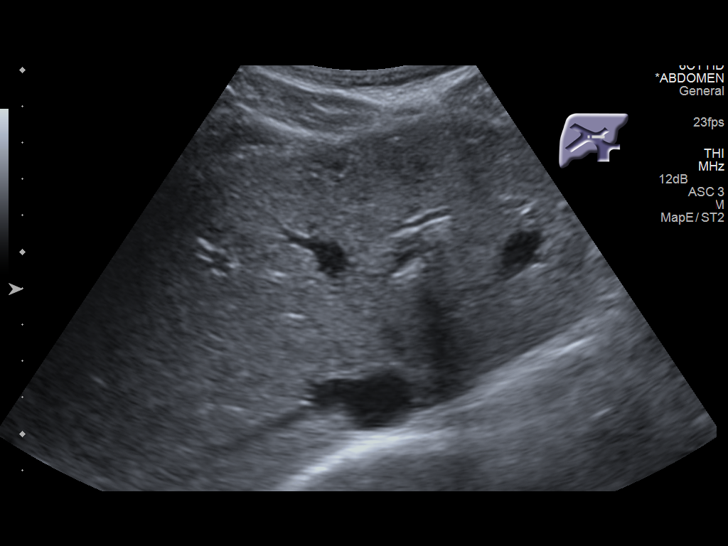
[im 34/82]
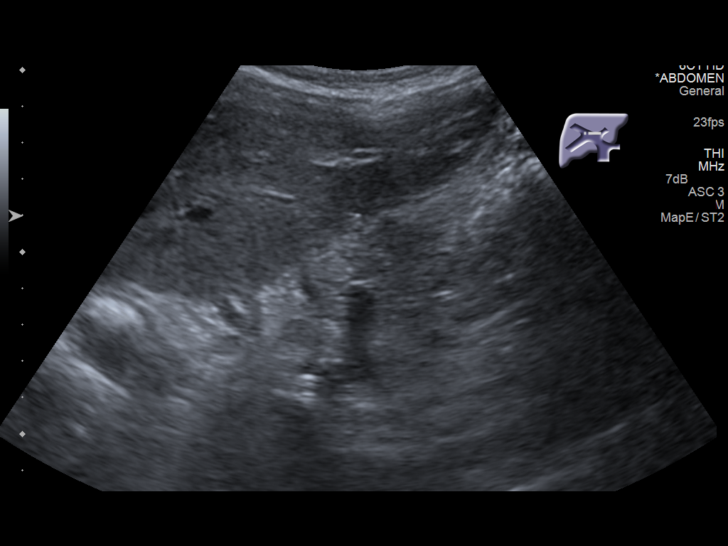
[im 41/82]
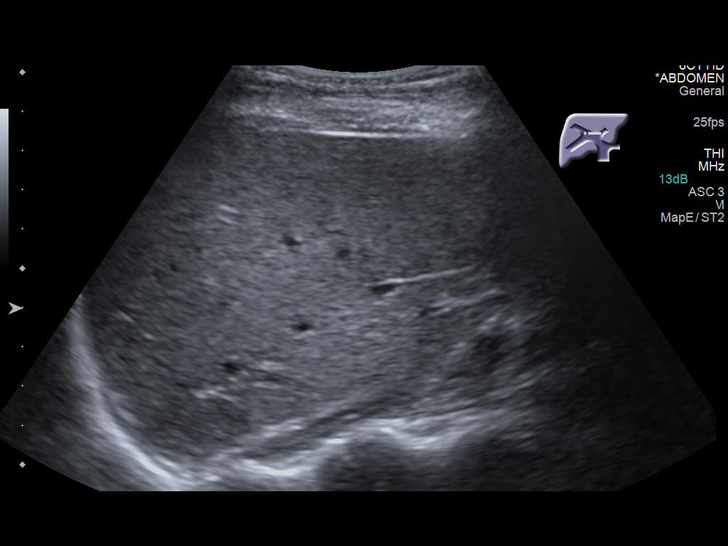
[im 48/82]
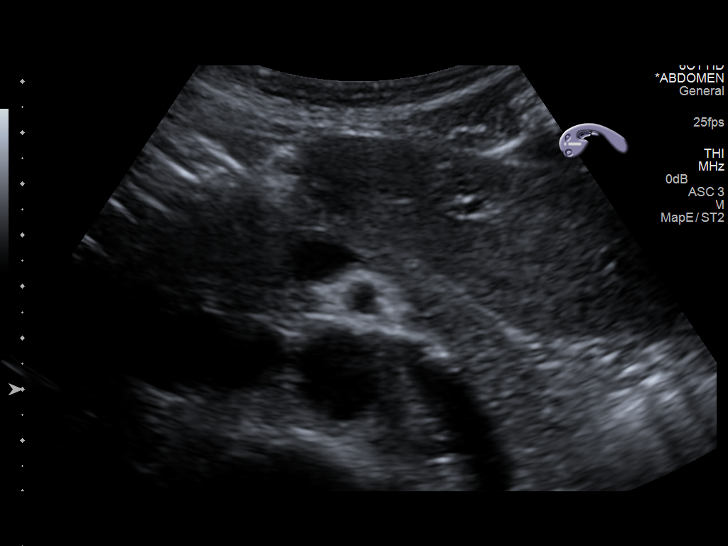
[im 55/82]
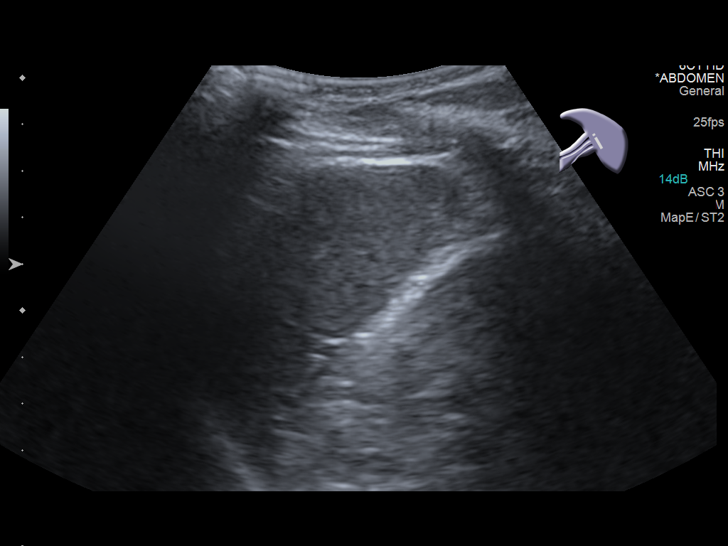
[im 61/82]
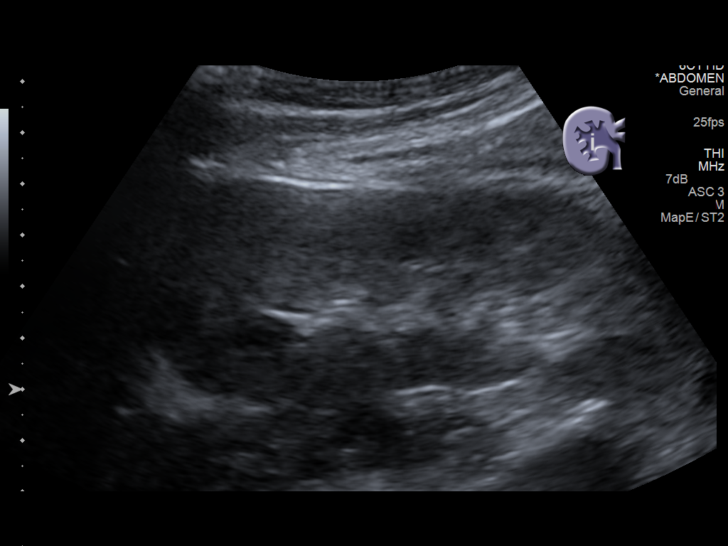
[im 68/82]
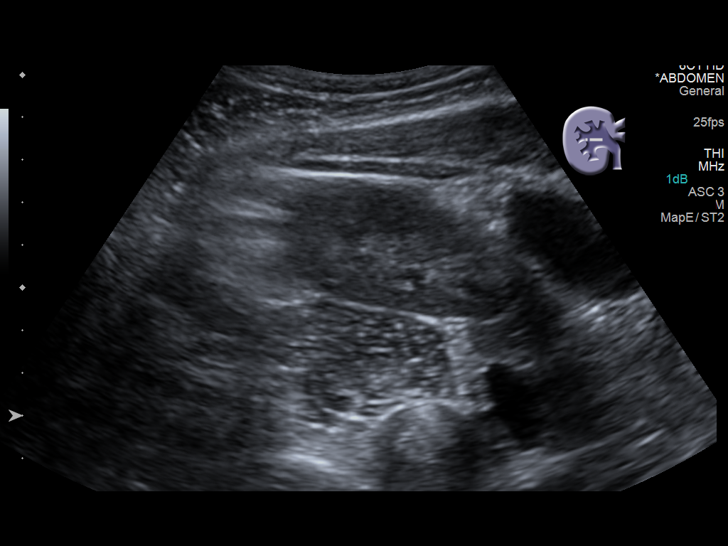
[im 75/82]
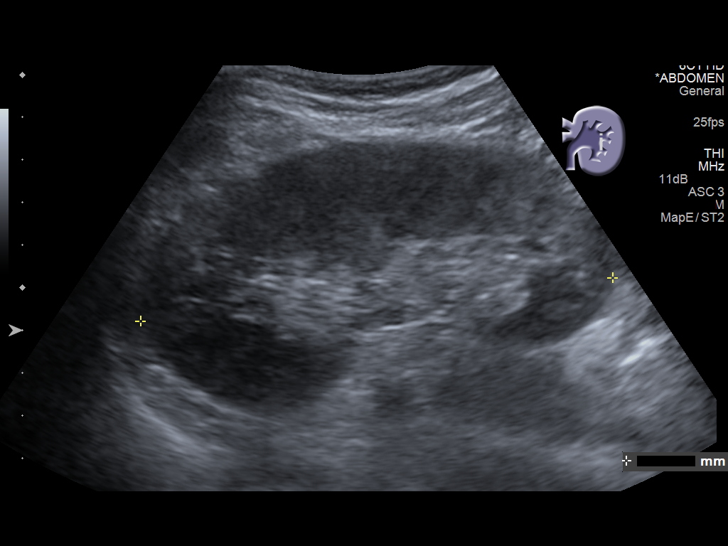
[im 82/82]
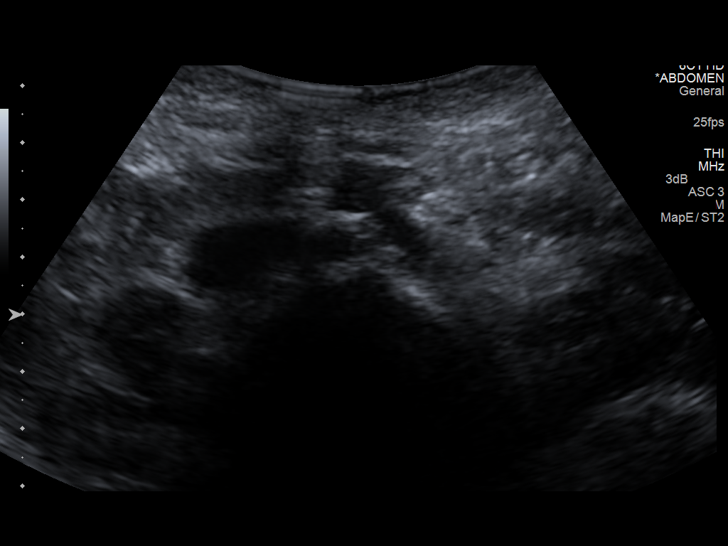

[13 of 25 positions shown; findings below may reference images not displayed]

FINDINGS: ULTRASOUND ABDOMEN

Gallbladder: No gallstones, gallbladder wall thickening, or
pericholecystic fluid. Negative sonographic Murphy's sign.

Common bile duct: Diameter: 3 mm

Liver: No focal lesion identified. Within normal limits in
parenchymal echogenicity.

IVC: No abnormality visualized.

Pancreas: Visualized portion unremarkable.

Spleen: Size and appearance within normal limits.

Right Kidney: Length: 10.7 cm. No mass or hydronephrosis.

Left Kidney: Length: 11.1 cm. No mass or hydronephrosis.

Abdominal aorta: No aneurysm visualized.

Other findings: None.

ULTRASOUND HEPATIC ELASTOGRAPHY

Device: Siemens Helix VTQ

Transducer 6C1

Patient position: Supine

Number of measurements:  10

Hepatic Segment:  8

Median velocity:   1.20  m/sec

IQR:

IQR/Median velocity ratio

Corresponding Metavir fibrosis score:  F2/F3

Risk of fibrosis: Moderate

Limitations of exam: None

Pertinent findings noted on other imaging exams:  None

Please note that abnormal shear wave velocities may also be
identified in clinical settings other than with hepatic fibrosis,
such as: acute hepatitis, elevated right heart and central venous
pressures including use of beta blockers, Linh disease
(Riheme), infiltrative processes such as
mastocytosis/amyloidosis/infiltrative tumor, extrahepatic
cholestasis, in the post-prandial state, and liver transplantation.
Correlation with patient history, laboratory data, and clinical
condition recommended.
IMPRESSION: Unremarkable abdominal ultrasound.

Median hepatic shear wave velocity is calculated at 1.20 m/sec.

Corresponding Metavir fibrosis score is F2/F3.

Risk of fibrosis is moderate.

Follow-up:  Additional testing appropriate.

## 2016-07-04 ENCOUNTER — Ambulatory Visit: Payer: Self-pay

## 2016-07-06 ENCOUNTER — Ambulatory Visit: Payer: Self-pay

## 2016-07-07 ENCOUNTER — Ambulatory Visit: Payer: Self-pay

## 2016-07-08 ENCOUNTER — Encounter: Payer: Self-pay | Admitting: Infectious Diseases

## 2016-11-21 ENCOUNTER — Encounter: Payer: Self-pay | Admitting: Infectious Diseases

## 2017-02-15 ENCOUNTER — Telehealth: Payer: Self-pay | Admitting: *Deleted

## 2017-02-15 NOTE — Telephone Encounter (Signed)
Call from [facility] stating that patient is requesting refill on his Atripla and he has not filled it since 11/2016. This is a 90 day lapse in his refills. Per Dr. Johnnye Sima it is ok to authorize refills of the Atripla. Pharmacist  Notified. Myrtis Hopping

## 2017-03-23 ENCOUNTER — Other Ambulatory Visit: Payer: Self-pay

## 2017-05-04 ENCOUNTER — Other Ambulatory Visit: Payer: Self-pay | Admitting: Infectious Diseases

## 2017-05-04 DIAGNOSIS — B20 Human immunodeficiency virus [HIV] disease: Secondary | ICD-10-CM

## 2017-05-23 ENCOUNTER — Encounter: Payer: Self-pay | Admitting: Infectious Diseases

## 2017-05-24 ENCOUNTER — Other Ambulatory Visit: Payer: Self-pay

## 2017-06-06 ENCOUNTER — Ambulatory Visit: Payer: Self-pay | Admitting: Infectious Diseases

## 2017-06-16 ENCOUNTER — Other Ambulatory Visit: Payer: Self-pay | Admitting: Infectious Diseases

## 2017-06-16 DIAGNOSIS — B20 Human immunodeficiency virus [HIV] disease: Secondary | ICD-10-CM

## 2017-07-05 ENCOUNTER — Other Ambulatory Visit: Payer: Self-pay | Admitting: *Deleted

## 2017-07-05 DIAGNOSIS — B20 Human immunodeficiency virus [HIV] disease: Secondary | ICD-10-CM

## 2017-07-05 MED ORDER — EFAVIRENZ-EMTRICITAB-TENOFOVIR 600-200-300 MG PO TABS: 1.0000 | ORAL_TABLET | Freq: Every day | ORAL | 0 refills | Status: DC

## 2017-07-10 ENCOUNTER — Other Ambulatory Visit: Payer: Self-pay

## 2017-07-10 DIAGNOSIS — Z79899 Other long term (current) drug therapy: Secondary | ICD-10-CM

## 2017-07-10 DIAGNOSIS — Z113 Encounter for screening for infections with a predominantly sexual mode of transmission: Secondary | ICD-10-CM

## 2017-07-10 DIAGNOSIS — B20 Human immunodeficiency virus [HIV] disease: Secondary | ICD-10-CM

## 2017-07-11 LAB — LIPID PANEL
CHOL/HDL RATIO: 1.8 (calc) (ref <5.0)
Cholesterol: 175 mg/dL (ref <200)
HDL: 95 mg/dL (ref >40)
LDL Cholesterol (Calc): 66 mg/dL (calc)
NON-HDL CHOLESTEROL (CALC): 80 mg/dL (ref <130)
Triglycerides: 60 mg/dL (ref <150)

## 2017-07-11 LAB — CBC WITH DIFFERENTIAL/PLATELET
BASOS ABS: 11 {cells}/uL (ref 0–200)
Basophils Relative: 0.2 %
EOS PCT: 2.8 %
Eosinophils Absolute: 160 cells/uL (ref 15–500)
HEMATOCRIT: 41.4 % (ref 38.5–50.0)
Hemoglobin: 14 g/dL (ref 13.2–17.1)
LYMPHS ABS: 1573 {cells}/uL (ref 850–3900)
MCH: 30.8 pg (ref 27.0–33.0)
MCHC: 33.8 g/dL (ref 32.0–36.0)
MCV: 91 fL (ref 80.0–100.0)
MPV: 10.5 fL (ref 7.5–12.5)
Monocytes Relative: 12.6 %
NEUTROS PCT: 56.8 %
Neutro Abs: 3238 cells/uL (ref 1500–7800)
PLATELETS: 258 10*3/uL (ref 140–400)
RBC: 4.55 10*6/uL (ref 4.20–5.80)
RDW: 12.3 % (ref 11.0–15.0)
TOTAL LYMPHOCYTE: 27.6 %
WBC: 5.7 10*3/uL (ref 3.8–10.8)
WBCMIX: 718 {cells}/uL (ref 200–950)

## 2017-07-11 LAB — RPR: RPR Ser Ql: REACTIVE — AB

## 2017-07-11 LAB — FLUORESCENT TREPONEMAL AB(FTA)-IGG-BLD: FLUORESCENT TREPONEMAL ABS: REACTIVE — AB

## 2017-07-11 LAB — URINE CYTOLOGY ANCILLARY ONLY
CHLAMYDIA, DNA PROBE: NEGATIVE
NEISSERIA GONORRHEA: NEGATIVE

## 2017-07-11 LAB — T-HELPER CELL (CD4) - (RCID CLINIC ONLY)
CD4 % Helper T Cell: 29 % — ABNORMAL LOW (ref 33–55)
CD4 T Cell Abs: 490 /uL (ref 400–2700)

## 2017-07-11 LAB — RPR TITER

## 2017-07-12 ENCOUNTER — Telehealth: Payer: Self-pay | Admitting: *Deleted

## 2017-07-12 LAB — COMPLETE METABOLIC PANEL WITH GFR
AG Ratio: 1.2 (calc) (ref 1.0–2.5)
ALBUMIN MSPROF: 4.1 g/dL (ref 3.6–5.1)
ALT: 14 U/L (ref 9–46)
AST: 21 U/L (ref 10–35)
Alkaline phosphatase (APISO): 84 U/L (ref 40–115)
BILIRUBIN TOTAL: 0.5 mg/dL (ref 0.2–1.2)
BUN / CREAT RATIO: 11 (calc) (ref 6–22)
BUN: 7 mg/dL (ref 7–25)
CALCIUM: 9.3 mg/dL (ref 8.6–10.3)
CHLORIDE: 109 mmol/L (ref 98–110)
CO2: 18 mmol/L — ABNORMAL LOW (ref 20–32)
Creat: 0.65 mg/dL — ABNORMAL LOW (ref 0.70–1.33)
GFR, EST AFRICAN AMERICAN: 131 mL/min/{1.73_m2} (ref >=60)
GFR, EST NON AFRICAN AMERICAN: 113 mL/min/{1.73_m2} (ref >=60)
GLUCOSE: 113 mg/dL — AB (ref 65–99)
Globulin: 3.4 g/dL (calc) (ref 1.9–3.7)
Potassium: 4 mmol/L (ref 3.5–5.3)
Sodium: 144 mmol/L (ref 135–146)
TOTAL PROTEIN: 7.5 g/dL (ref 6.1–8.1)

## 2017-07-12 NOTE — Telephone Encounter (Signed)
George Riches, MD  [facility] Triage Nurse Pool        Pt needs Penicillin G IM 2.4 million units IM x 1  Thanks    Called patient and left a voice mail to return the call.

## 2017-07-17 ENCOUNTER — Ambulatory Visit (INDEPENDENT_AMBULATORY_CARE_PROVIDER_SITE_OTHER): Payer: Self-pay | Admitting: *Deleted

## 2017-07-17 DIAGNOSIS — B20 Human immunodeficiency virus [HIV] disease: Secondary | ICD-10-CM

## 2017-07-17 DIAGNOSIS — A539 Syphilis, unspecified: Secondary | ICD-10-CM

## 2017-07-17 MED ORDER — PENICILLIN G BENZATHINE 1200000 UNIT/2ML IM SUSP
1.2000 10*6.[IU] | Freq: Once | INTRAMUSCULAR | Status: AC
  Administered 2017-07-17: 1.2 10*6.[IU] via INTRAMUSCULAR

## 2017-07-19 ENCOUNTER — Ambulatory Visit: Payer: Self-pay

## 2017-07-19 LAB — HIV-1 RNA ULTRAQUANT REFLEX TO GENTYP+
HIV 1 RNA Quant: 12400 Copies/mL — ABNORMAL HIGH
HIV-1 RNA Quant, Log: 4.09 Log cps/mL — ABNORMAL HIGH

## 2017-07-19 LAB — RFLX HIV-1 INTEGRASE GENOTYPE: HIV-1 Genotype: DETECTED — AB

## 2017-07-24 ENCOUNTER — Encounter: Payer: Self-pay | Admitting: Infectious Diseases

## 2017-07-24 ENCOUNTER — Ambulatory Visit (INDEPENDENT_AMBULATORY_CARE_PROVIDER_SITE_OTHER): Payer: Self-pay | Admitting: Infectious Diseases

## 2017-07-24 VITALS — BP 126/88 | HR 80 | Temp 98.4°F | Wt 112.4 lb

## 2017-07-24 DIAGNOSIS — F17200 Nicotine dependence, unspecified, uncomplicated: Secondary | ICD-10-CM

## 2017-07-24 DIAGNOSIS — B182 Chronic viral hepatitis C: Secondary | ICD-10-CM

## 2017-07-24 DIAGNOSIS — B20 Human immunodeficiency virus [HIV] disease: Secondary | ICD-10-CM

## 2017-07-24 DIAGNOSIS — I10 Essential (primary) hypertension: Secondary | ICD-10-CM

## 2017-07-24 DIAGNOSIS — Z113 Encounter for screening for infections with a predominantly sexual mode of transmission: Secondary | ICD-10-CM

## 2017-07-24 DIAGNOSIS — A539 Syphilis, unspecified: Secondary | ICD-10-CM

## 2017-07-24 DIAGNOSIS — Z79899 Other long term (current) drug therapy: Secondary | ICD-10-CM

## 2017-07-24 DIAGNOSIS — Z23 Encounter for immunization: Secondary | ICD-10-CM

## 2017-07-24 MED ORDER — PENICILLIN G BENZATHINE 1200000 UNIT/2ML IM SUSP
2.4000 10*6.[IU] | Freq: Once | INTRAMUSCULAR | Status: AC
  Administered 2017-07-24: 2.4 10*6.[IU] via INTRAMUSCULAR

## 2017-07-24 NOTE — Assessment & Plan Note (Addendum)
On no rx Normotensive today Continue to watch

## 2017-07-24 NOTE — Assessment & Plan Note (Addendum)
He was off meds til 11-12.  Does not want to update his art (symfi lo?) Offered/refused condoms.  Has gotten flu shot.  Will start mening.  rtc in 6 months

## 2017-07-24 NOTE — Assessment & Plan Note (Signed)
Will give him Pen G x 3

## 2017-07-24 NOTE — Assessment & Plan Note (Signed)
Encouraged to quit smoking.  

## 2017-07-24 NOTE — Progress Notes (Signed)
   Subjective:    Patient ID: George Boyle, male    DOB: 01-18-1966, 51 y.o.   MRN: [id number]  HPI 51 yo M with HIV+ diagnosed in 2000. Was prev in [place] for care, on atripla.  Previous Hep B immune. Had anal pap 10-2013 that was (-).  Had Hep C- 1b and was treated with Zapatier (his duration of rx is unclear- possibly only 1 month). F2/3. He had f/u Hep C rna 04-25-17.    August 2018 he was changed back to atripla at his request. Has been doing well with this.   Was seen in ED- rectal abscess- on   He has 1:64 RPR at his last blood work. He questions why he has spots on his hands and his feet. He got his first injection on 11-12.   HIV 1 RNA Quant  Date Value  07/10/2017 12,400 Copies/mL (H)  04/11/2016 57 copies/mL (H)  03/16/2015 <20 copies/mL   CD4 T Cell Abs (/uL)  Date Value  07/10/2017 490  04/11/2016 600  03/16/2015 420      Review of Systems  Constitutional: Negative for appetite change, chills, fever and unexpected weight change.  Gastrointestinal: Negative for constipation and diarrhea.  Genitourinary: Negative for difficulty urinating.  Skin: Positive for rash.  Psychiatric/Behavioral: Negative for dysphoric mood and sleep disturbance.  Please see HPI. All other systems reviewed and negative.     Objective:   Physical Exam  Constitutional: He appears well-developed and well-nourished.  HENT:  Mouth/Throat: No oropharyngeal exudate.  Eyes: EOM are normal. Pupils are equal, round, and reactive to light.  Neck: Neck supple.  Cardiovascular: Normal rate, regular rhythm and normal heart sounds.  Pulmonary/Chest: Effort normal and breath sounds normal.  Abdominal: Soft. Bowel sounds are normal. There is no tenderness. There is no rebound.  Musculoskeletal: Normal range of motion. He exhibits no edema.  Lymphadenopathy:    He has no cervical adenopathy.  Skin: Rash noted.  Psychiatric: He has a normal mood and affect.        Assessment & Plan:

## 2017-07-24 NOTE — Assessment & Plan Note (Signed)
"  cured" on minimal rx.  Will f/u prn Consider repeat u/s

## 2017-09-18 ENCOUNTER — Ambulatory Visit: Payer: Self-pay

## 2017-09-20 ENCOUNTER — Other Ambulatory Visit: Payer: Self-pay | Admitting: Infectious Diseases

## 2017-09-20 DIAGNOSIS — B20 Human immunodeficiency virus [HIV] disease: Secondary | ICD-10-CM

## 2017-11-29 IMAGING — CR DG CHEST 2V
2 series · 2 of 2 positions shown · non-contrast
Comparison: None.

CLINICAL DATA: Right-sided chest pain for 3 days.

EXAM:
CHEST  2 VIEW

[chest pa]
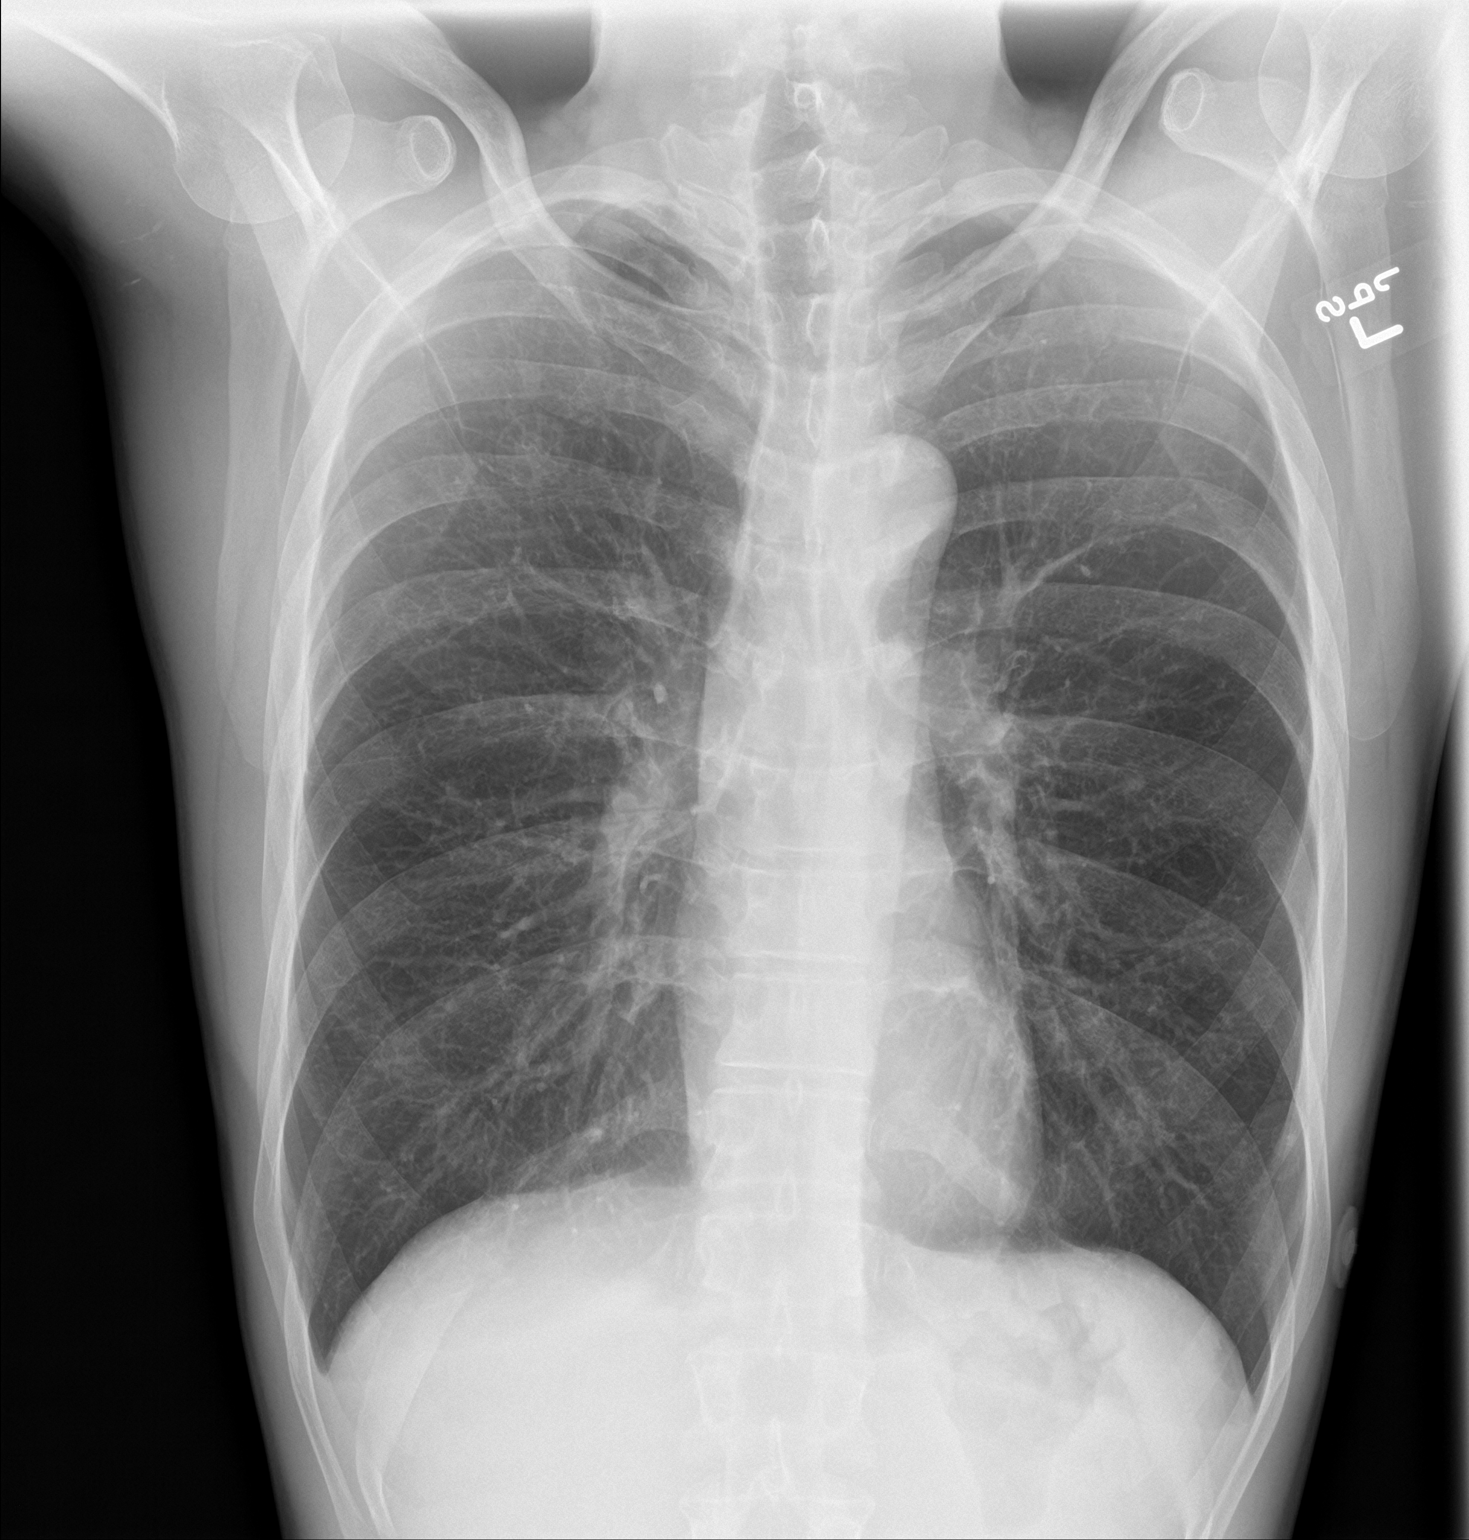

[chest lat]
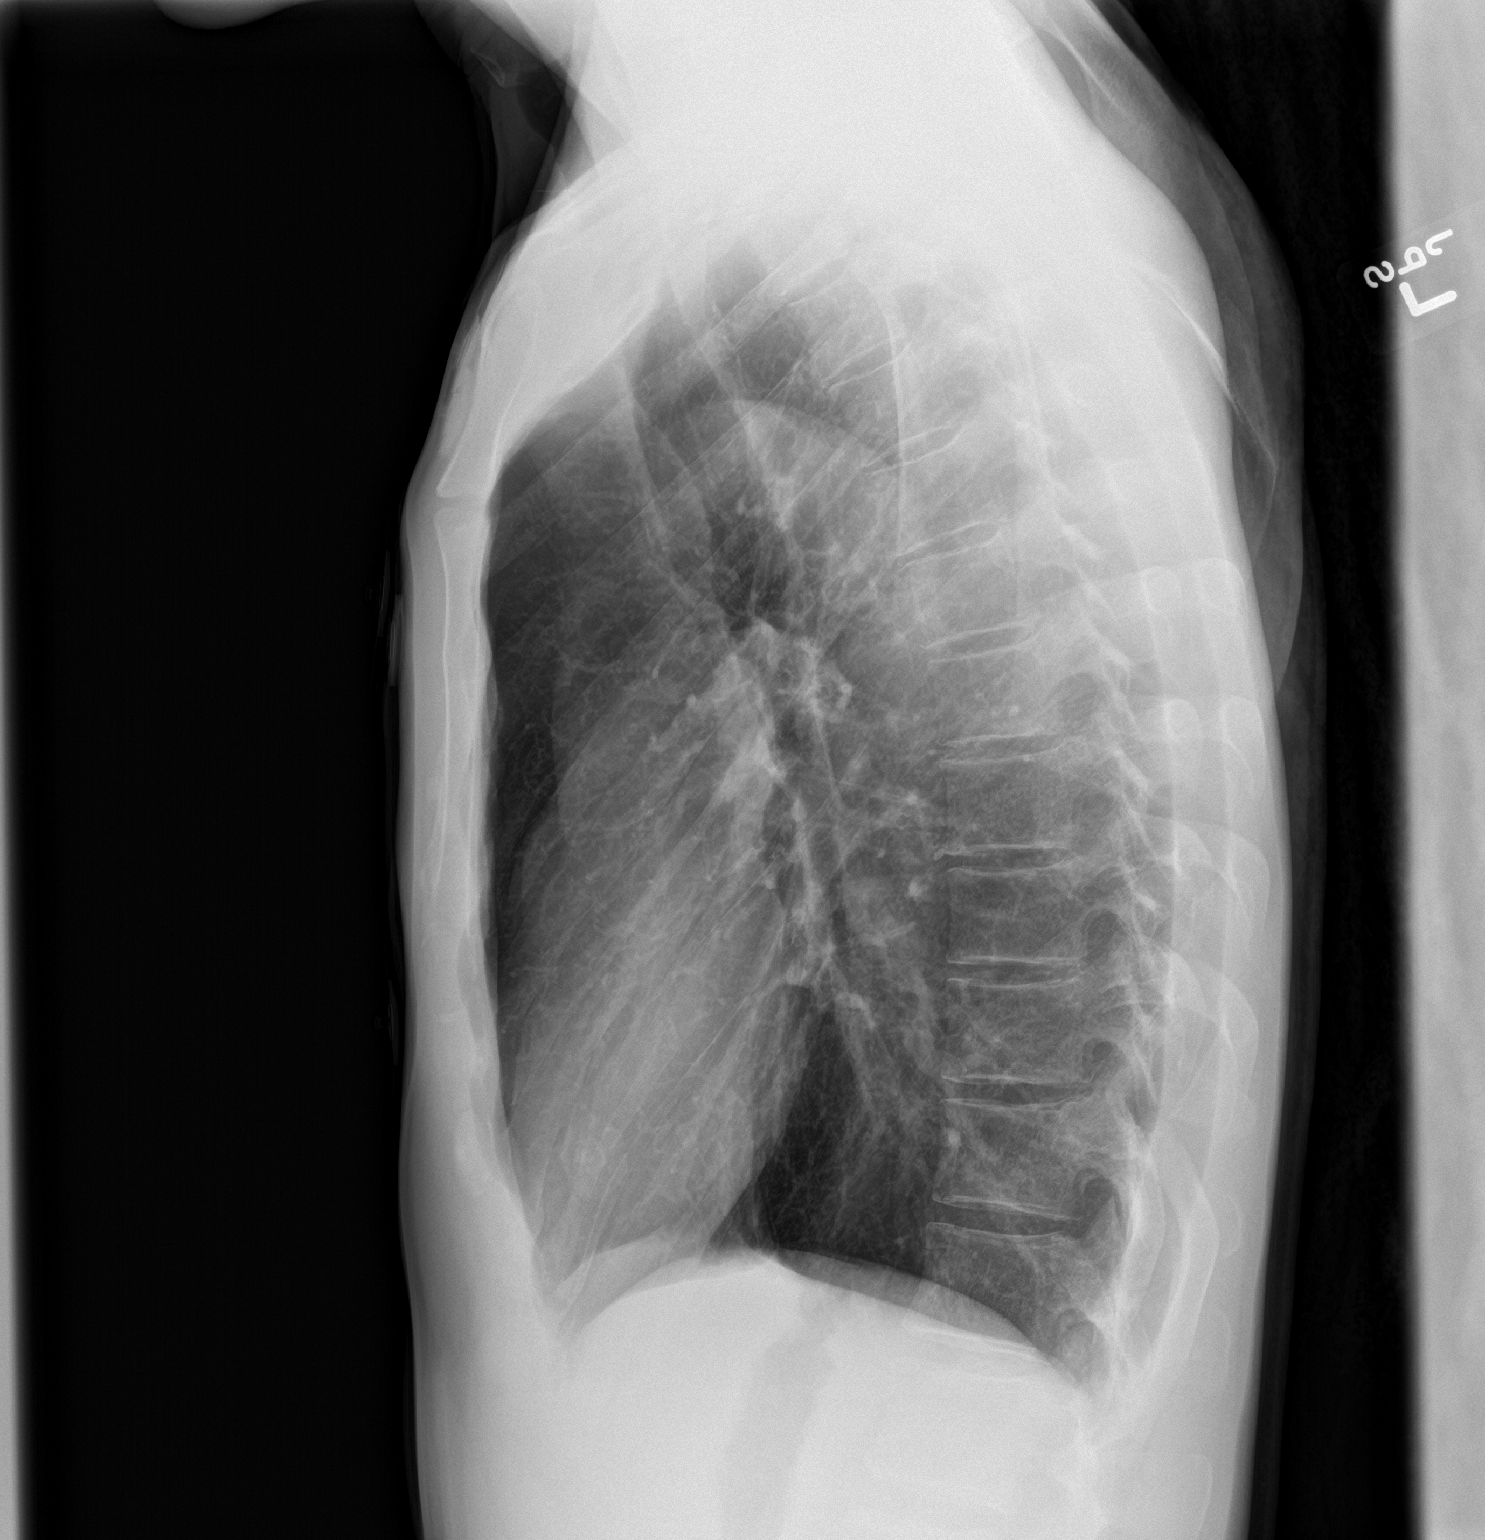

[2 of 2 positions shown; findings below may reference images not displayed]

FINDINGS: The heart size and mediastinal contours are within normal limits.
Both lungs are clear. The visualized skeletal structures are
unremarkable.
IMPRESSION: No active cardiopulmonary disease.

## 2018-01-01 ENCOUNTER — Other Ambulatory Visit: Payer: Self-pay | Admitting: *Deleted

## 2018-01-01 ENCOUNTER — Ambulatory Visit: Payer: Self-pay

## 2018-01-01 DIAGNOSIS — B20 Human immunodeficiency virus [HIV] disease: Secondary | ICD-10-CM

## 2018-01-01 MED ORDER — EFAVIRENZ-EMTRICITAB-TENOFOVIR 600-200-300 MG PO TABS: 1.0000 | ORAL_TABLET | Freq: Every day | ORAL | 4 refills | Status: DC

## 2018-01-02 ENCOUNTER — Encounter: Payer: Self-pay | Admitting: Infectious Diseases

## 2018-01-22 ENCOUNTER — Other Ambulatory Visit: Payer: Self-pay

## 2018-02-05 ENCOUNTER — Ambulatory Visit: Payer: Self-pay | Admitting: Infectious Diseases

## 2018-02-19 ENCOUNTER — Other Ambulatory Visit: Payer: Self-pay

## 2018-02-26 ENCOUNTER — Other Ambulatory Visit: Payer: Self-pay

## 2018-02-26 DIAGNOSIS — A539 Syphilis, unspecified: Secondary | ICD-10-CM

## 2018-02-26 DIAGNOSIS — B20 Human immunodeficiency virus [HIV] disease: Secondary | ICD-10-CM

## 2018-02-26 DIAGNOSIS — Z79899 Other long term (current) drug therapy: Secondary | ICD-10-CM

## 2018-02-26 DIAGNOSIS — Z113 Encounter for screening for infections with a predominantly sexual mode of transmission: Secondary | ICD-10-CM

## 2018-02-27 LAB — LIPID PANEL
CHOL/HDL RATIO: 2 (calc) (ref <5.0)
CHOLESTEROL: 169 mg/dL (ref <200)
HDL: 83 mg/dL (ref >40)
LDL Cholesterol (Calc): 68 mg/dL (calc)
Non-HDL Cholesterol (Calc): 86 mg/dL (calc) (ref <130)
Triglycerides: 94 mg/dL (ref <150)

## 2018-02-27 LAB — COMPREHENSIVE METABOLIC PANEL
AG Ratio: 1.4 (calc) (ref 1.0–2.5)
ALBUMIN MSPROF: 3.8 g/dL (ref 3.6–5.1)
ALT: 28 U/L (ref 9–46)
AST: 35 U/L (ref 10–35)
Alkaline phosphatase (APISO): 83 U/L (ref 40–115)
BILIRUBIN TOTAL: 0.5 mg/dL (ref 0.2–1.2)
BUN: 9 mg/dL (ref 7–25)
CALCIUM: 9.2 mg/dL (ref 8.6–10.3)
CO2: 25 mmol/L (ref 20–32)
CREATININE: 0.88 mg/dL (ref 0.70–1.33)
Chloride: 106 mmol/L (ref 98–110)
GLUCOSE: 130 mg/dL — AB (ref 65–99)
Globulin: 2.8 g/dL (calc) (ref 1.9–3.7)
POTASSIUM: 3.9 mmol/L (ref 3.5–5.3)
SODIUM: 140 mmol/L (ref 135–146)
TOTAL PROTEIN: 6.6 g/dL (ref 6.1–8.1)

## 2018-02-27 LAB — CBC
HCT: 41.4 % (ref 38.5–50.0)
Hemoglobin: 14.3 g/dL (ref 13.2–17.1)
MCH: 32.6 pg (ref 27.0–33.0)
MCHC: 34.5 g/dL (ref 32.0–36.0)
MCV: 94.5 fL (ref 80.0–100.0)
MPV: 9.7 fL (ref 7.5–12.5)
PLATELETS: 275 10*3/uL (ref 140–400)
RBC: 4.38 10*6/uL (ref 4.20–5.80)
RDW: 13.1 % (ref 11.0–15.0)
WBC: 7.4 10*3/uL (ref 3.8–10.8)

## 2018-02-27 LAB — RPR TITER

## 2018-02-27 LAB — URINE CYTOLOGY ANCILLARY ONLY
CHLAMYDIA, DNA PROBE: NEGATIVE
NEISSERIA GONORRHEA: NEGATIVE

## 2018-02-27 LAB — T-HELPER CELL (CD4) - (RCID CLINIC ONLY)
CD4 % Helper T Cell: 20 % — ABNORMAL LOW (ref 33–55)
CD4 T Cell Abs: 480 /uL (ref 400–2700)

## 2018-02-27 LAB — RPR: RPR Ser Ql: REACTIVE — AB

## 2018-02-27 LAB — FLUORESCENT TREPONEMAL AB(FTA)-IGG-BLD: FLUORESCENT TREPONEMAL ABS: REACTIVE — AB

## 2018-03-01 LAB — HIV-1 RNA QUANT-NO REFLEX-BLD
HIV 1 RNA Quant: 48 copies/mL — ABNORMAL HIGH
HIV-1 RNA QUANT, LOG: 1.68 {Log_copies}/mL — AB

## 2018-03-05 ENCOUNTER — Ambulatory Visit: Payer: Self-pay

## 2018-03-05 ENCOUNTER — Encounter: Payer: Self-pay | Admitting: Infectious Diseases

## 2018-03-05 ENCOUNTER — Ambulatory Visit (INDEPENDENT_AMBULATORY_CARE_PROVIDER_SITE_OTHER): Payer: Self-pay | Admitting: Infectious Diseases

## 2018-03-05 VITALS — Ht 66.0 in | Wt 107.0 lb

## 2018-03-05 DIAGNOSIS — Z79899 Other long term (current) drug therapy: Secondary | ICD-10-CM

## 2018-03-05 DIAGNOSIS — B182 Chronic viral hepatitis C: Secondary | ICD-10-CM

## 2018-03-05 DIAGNOSIS — B20 Human immunodeficiency virus [HIV] disease: Secondary | ICD-10-CM

## 2018-03-05 DIAGNOSIS — F17200 Nicotine dependence, unspecified, uncomplicated: Secondary | ICD-10-CM

## 2018-03-05 DIAGNOSIS — Z23 Encounter for immunization: Secondary | ICD-10-CM

## 2018-03-05 DIAGNOSIS — A539 Syphilis, unspecified: Secondary | ICD-10-CM

## 2018-03-05 DIAGNOSIS — Z113 Encounter for screening for infections with a predominantly sexual mode of transmission: Secondary | ICD-10-CM

## 2018-03-05 DIAGNOSIS — A630 Anogenital (venereal) warts: Secondary | ICD-10-CM | POA: Insufficient documentation

## 2018-03-05 NOTE — Assessment & Plan Note (Signed)
Encouraged him to quit 

## 2018-03-05 NOTE — Assessment & Plan Note (Signed)
Will give him 2nd mening today.  PCV at next visit.  Offered/refused condoms.  Will continue him on atripla at his preference.  rtc in 9 months.

## 2018-03-05 NOTE — Assessment & Plan Note (Signed)
RPR has come down.  Will continue to follow.

## 2018-03-05 NOTE — Assessment & Plan Note (Signed)
Has oral condyloma.  Needs dental f/u.

## 2018-03-05 NOTE — Progress Notes (Signed)
   Subjective:    Patient ID: George Boyle, male    DOB: 02-21-1966, 52 y.o.   MRN: [id number]  HPI 52 yo M with HIV+ diagnosed in 2000. Was prev in [place] for care, on atripla.  Previous Hep B immune. Had anal pap 10-2013 that was (-).  Had Hep C- 1b and was treated with Zapatier (his duration of rx is unclear- possibly only 1 month). F2/3. He had f/u Hep C rna 04-25-16 (-).    August 2018 he was changed back to atripla at his request.  Has been doing well. Here with his daughter.  Denies missed ART.  Tried chantix but did not work and made him very irritable.   HIV 1 RNA Quant  Date Value  02/26/2018 48 copies/mL (H)  07/10/2017 12,400 Copies/mL (H)  04/11/2016 57 copies/mL (H)   CD4 T Cell Abs (/uL)  Date Value  02/26/2018 480  07/10/2017 490  04/11/2016 600    Review of Systems  Constitutional: Negative for appetite change and unexpected weight change.  Respiratory: Negative for shortness of breath.   Gastrointestinal: Negative for constipation and diarrhea.  Genitourinary: Negative for difficulty urinating.  Neurological: Negative for dizziness and headaches.  Psychiatric/Behavioral: Negative for dysphoric mood.  Please see HPI. All other systems reviewed and negative.      Objective:   Physical Exam  Constitutional: He appears well-developed and well-nourished.  HENT:  Mouth/Throat: No oropharyngeal exudate.  Eyes: Pupils are equal, round, and reactive to light. EOM are normal.  Neck: Normal range of motion. Neck supple.  Cardiovascular: Normal rate, regular rhythm and normal heart sounds.  Pulmonary/Chest: Effort normal and breath sounds normal.  Abdominal: Soft. Bowel sounds are normal. There is no tenderness. There is no guarding.  Musculoskeletal: Normal range of motion. He exhibits no edema.  Lymphadenopathy:    He has no cervical adenopathy.  Psychiatric: He has a normal mood and affect.       Assessment & Plan:

## 2018-03-07 ENCOUNTER — Encounter: Payer: Self-pay | Admitting: Infectious Diseases

## 2018-04-03 NOTE — Addendum Note (Signed)
Addended by: Landis Gandy on: 04/03/2018 12:56 PM   Modules accepted: Orders

## 2018-07-30 ENCOUNTER — Other Ambulatory Visit: Payer: Self-pay | Admitting: *Deleted

## 2018-07-30 ENCOUNTER — Telehealth: Payer: Self-pay | Admitting: Infectious Diseases

## 2018-07-30 DIAGNOSIS — B20 Human immunodeficiency virus [HIV] disease: Secondary | ICD-10-CM

## 2018-07-30 MED ORDER — EFAVIRENZ-EMTRICITAB-TENOFOVIR 600-200-300 MG PO TABS: 1.0000 | ORAL_TABLET | Freq: Every day | ORAL | 5 refills | Status: DC

## 2018-07-30 NOTE — Telephone Encounter (Signed)
[facility] is needing script sent in for atripla.  Thank you!

## 2019-02-19 ENCOUNTER — Other Ambulatory Visit: Payer: Self-pay

## 2019-02-19 DIAGNOSIS — B20 Human immunodeficiency virus [HIV] disease: Secondary | ICD-10-CM

## 2019-02-19 MED ORDER — ATRIPLA 600-200-300 MG PO TABS: 1.0000 | ORAL_TABLET | Freq: Every day | ORAL | 0 refills | Status: DC

## 2019-02-28 ENCOUNTER — Encounter: Payer: Self-pay | Admitting: Infectious Diseases

## 2019-06-21 ENCOUNTER — Encounter: Payer: Self-pay | Admitting: Infectious Diseases

## 2019-06-24 ENCOUNTER — Telehealth: Payer: Self-pay | Admitting: Pharmacy Technician

## 2019-06-24 NOTE — Telephone Encounter (Signed)
[facility] Patient Advocate Encounter   Patient has been approved for [employer] Advancing Access Patient Assistance Program for Atripla for 30-day coverage.  This assistance will make the patient's copay $0.  I tried to reach the patient to update his status but no voicemail was available. Will try alternate number.  The billing information is as follows and has been shared with [employer].  Member ID: [id number] [place]: [id number] PCN: [id number] Group: [id number]  Patient knows to call the office with questions or concerns.  Bartholomew Crews, CPhT Specialty Pharmacy Patient [facility] Phone: [phone] Fax: [phone] 06/24/2019 9:43 AM

## 2019-08-22 ENCOUNTER — Other Ambulatory Visit: Payer: Self-pay | Admitting: Infectious Diseases

## 2019-08-22 ENCOUNTER — Telehealth: Payer: Self-pay

## 2019-08-22 DIAGNOSIS — B20 Human immunodeficiency virus [HIV] disease: Secondary | ICD-10-CM

## 2019-08-22 NOTE — Telephone Encounter (Signed)
Attempted to reach out to patient to schedule appointment for lab work and office visit two weeks after. Unable to reach patient at this time. Mailbox is full and unable to accept new messages at this time. Patient will need schedule/keep appointment for future refills. [place]

## 2019-08-26 ENCOUNTER — Other Ambulatory Visit: Payer: Self-pay

## 2019-08-26 DIAGNOSIS — Z113 Encounter for screening for infections with a predominantly sexual mode of transmission: Secondary | ICD-10-CM

## 2019-08-26 DIAGNOSIS — Z79899 Other long term (current) drug therapy: Secondary | ICD-10-CM

## 2019-08-26 DIAGNOSIS — B20 Human immunodeficiency virus [HIV] disease: Secondary | ICD-10-CM

## 2019-08-27 LAB — URINE CYTOLOGY ANCILLARY ONLY
Chlamydia: NEGATIVE
Comment: NEGATIVE
Comment: NORMAL
Neisseria Gonorrhea: NEGATIVE

## 2019-08-27 LAB — T-HELPER CELL (CD4) - (RCID CLINIC ONLY)
CD4 % Helper T Cell: 31 % — ABNORMAL LOW (ref 33–65)
CD4 T Cell Abs: 743 /uL (ref 400–1790)

## 2019-09-03 LAB — HIV-1 RNA QUANT-NO REFLEX-BLD
HIV 1 RNA Quant: <20 copies/mL — AB
HIV-1 RNA Quant, Log: <1.3 Log copies/mL — AB

## 2019-09-03 LAB — COMPREHENSIVE METABOLIC PANEL
AG Ratio: 1.3 (calc) (ref 1.0–2.5)
ALT: 14 U/L (ref 9–46)
AST: 18 U/L (ref 10–35)
Albumin: 3.7 g/dL (ref 3.6–5.1)
Alkaline phosphatase (APISO): 80 U/L (ref 35–144)
BUN: 7 mg/dL (ref 7–25)
CO2: 27 mmol/L (ref 20–32)
Calcium: 9.4 mg/dL (ref 8.6–10.3)
Chloride: 106 mmol/L (ref 98–110)
Creat: 0.87 mg/dL (ref 0.70–1.33)
Globulin: 2.8 g/dL (calc) (ref 1.9–3.7)
Glucose, Bld: 98 mg/dL (ref 65–99)
Potassium: 4.4 mmol/L (ref 3.5–5.3)
Sodium: 141 mmol/L (ref 135–146)
Total Bilirubin: 0.6 mg/dL (ref 0.2–1.2)
Total Protein: 6.5 g/dL (ref 6.1–8.1)

## 2019-09-03 LAB — CBC WITH DIFFERENTIAL/PLATELET
Absolute Monocytes: 710 cells/uL (ref 200–950)
Basophils Absolute: 46 cells/uL (ref 0–200)
Basophils Relative: 0.5 %
Eosinophils Absolute: 482 cells/uL (ref 15–500)
Eosinophils Relative: 5.3 %
HCT: 40.7 % (ref 38.5–50.0)
Hemoglobin: 13.8 g/dL (ref 13.2–17.1)
Lymphs Abs: 2575 cells/uL (ref 850–3900)
MCH: 32.6 pg (ref 27.0–33.0)
MCHC: 33.9 g/dL (ref 32.0–36.0)
MCV: 96.2 fL (ref 80.0–100.0)
MPV: 9.8 fL (ref 7.5–12.5)
Monocytes Relative: 7.8 %
Neutro Abs: 5287 cells/uL (ref 1500–7800)
Neutrophils Relative %: 58.1 %
Platelets: 327 10*3/uL (ref 140–400)
RBC: 4.23 10*6/uL (ref 4.20–5.80)
RDW: 11.8 % (ref 11.0–15.0)
Total Lymphocyte: 28.3 %
WBC: 9.1 10*3/uL (ref 3.8–10.8)

## 2019-09-03 LAB — LIPID PANEL
Cholesterol: 178 mg/dL (ref <200)
HDL: 83 mg/dL (ref >=40)
LDL Cholesterol (Calc): 79 mg/dL (calc)
Non-HDL Cholesterol (Calc): 95 mg/dL (calc) (ref <130)
Total CHOL/HDL Ratio: 2.1 (calc) (ref <5.0)
Triglycerides: 77 mg/dL (ref <150)

## 2019-09-03 LAB — RPR: RPR Ser Ql: REACTIVE — AB

## 2019-09-03 LAB — RPR TITER: RPR Titer: 1:2 {titer} — ABNORMAL HIGH

## 2019-09-03 LAB — FLUORESCENT TREPONEMAL AB(FTA)-IGG-BLD: Fluorescent Treponemal ABS: REACTIVE — AB

## 2019-09-16 ENCOUNTER — Telehealth: Payer: Self-pay

## 2019-09-16 NOTE — Telephone Encounter (Signed)
COVID-19 Pre-Screening Questions:09/16/19  Do you currently have a fever (>100 F), chills or unexplained body aches?NO   Are you currently experiencing new cough, shortness of breath, sore throat, runny nose?NO  .  Have you recently travelled outside the state of [place] in the last 14 days?NO .  Have you been in contact with someone that is currently pending confirmation of Covid19 testing or has been confirmed to have the [place] virus? NO  **If the patient answers NO to ALL questions -  advise the patient to please call the clinic before coming to the office should any symptoms develop.

## 2019-09-17 ENCOUNTER — Ambulatory Visit (INDEPENDENT_AMBULATORY_CARE_PROVIDER_SITE_OTHER): Payer: Self-pay | Admitting: Infectious Diseases

## 2019-09-17 ENCOUNTER — Encounter: Payer: Self-pay | Admitting: Infectious Diseases

## 2019-09-17 ENCOUNTER — Other Ambulatory Visit: Payer: Self-pay

## 2019-09-17 VITALS — BP 118/83 | HR 82 | Temp 98.6°F | Resp 12 | Ht 66.0 in | Wt 109.0 lb

## 2019-09-17 DIAGNOSIS — Z113 Encounter for screening for infections with a predominantly sexual mode of transmission: Secondary | ICD-10-CM

## 2019-09-17 DIAGNOSIS — Z79899 Other long term (current) drug therapy: Secondary | ICD-10-CM

## 2019-09-17 DIAGNOSIS — Z23 Encounter for immunization: Secondary | ICD-10-CM

## 2019-09-17 DIAGNOSIS — K089 Disorder of teeth and supporting structures, unspecified: Secondary | ICD-10-CM

## 2019-09-17 DIAGNOSIS — R350 Frequency of micturition: Secondary | ICD-10-CM | POA: Insufficient documentation

## 2019-09-17 DIAGNOSIS — B20 Human immunodeficiency virus [HIV] disease: Secondary | ICD-10-CM

## 2019-09-17 LAB — PSA: PSA: 2 ng/mL (ref <=4.0)

## 2019-09-17 MED ORDER — EFAVIRENZ-EMTRICITAB-TENOFOVIR 600-200-300 MG PO TABS: 1.0000 | ORAL_TABLET | Freq: Every day | ORAL | 0 refills | Status: DC

## 2019-09-17 NOTE — Addendum Note (Signed)
Addended by: Elta Guadeloupe on: 09/17/2019 04:49 PM   Modules accepted: Orders

## 2019-09-17 NOTE — Assessment & Plan Note (Signed)
He is doing well Offered/refused condoms.  PCV 13 and flu vax today.  rtc in 9 months Refill atripla

## 2019-09-17 NOTE — Progress Notes (Signed)
   Subjective:    Patient ID: George Boyle, male    DOB: 12-25-1965, 54 y.o.   MRN: [id number]  HPI 54yo M with HIV+ diagnosed in 2000. Was prev in [place] for care, on atripla.  Previous Hep B immune. Had anal pap 10-2013 that was (-).  Had Hep C- 1band was treated with Zapatier (his duration of rx is unclear- possibly only 1 month). F2/3. He had f/u Hep C rna 04-25-16 (-).   August 2018 he was changed back to atripla at his request, from [place] . Has been off his ART for last 3 weeks, ran out. Will change to newer version.  For last 4 months has been caring for his mother after she had CABG post MI.   HIV 1 RNA Quant  Date Value  08/26/2019 <20 DETECTED copies/mL (A)  02/26/2018 48 copies/mL (H)  07/10/2017 12,400 Copies/mL (H)   CD4 T Cell Abs (/uL)  Date Value  08/26/2019 743  02/26/2018 480  07/10/2017 490    Review of Systems  Constitutional: Negative for appetite change and unexpected weight change.  Respiratory: Negative for cough and shortness of breath.   Gastrointestinal: Negative for constipation and diarrhea.  Genitourinary: Positive for frequency. Negative for difficulty urinating.  Psychiatric/Behavioral: Negative for sleep disturbance.       Objective:   Physical Exam Constitutional:      General: He is not in acute distress.    Appearance: Normal appearance. He is not ill-appearing or toxic-appearing.  HENT:     Mouth/Throat:     Mouth: Mucous membranes are moist.     Pharynx: No oropharyngeal exudate.  Eyes:     Extraocular Movements: Extraocular movements intact.     Pupils: Pupils are equal, round, and reactive to light.  Cardiovascular:     Rate and Rhythm: Normal rate and regular rhythm.  Pulmonary:     Effort: Pulmonary effort is normal.     Breath sounds: Normal breath sounds.  Abdominal:     General: Bowel sounds are normal. There is no distension.     Palpations: Abdomen is soft.     Tenderness: There is no abdominal tenderness.    Musculoskeletal:     Cervical back: Normal range of motion and neck supple.     Right lower leg: No edema.     Left lower leg: No edema.  Neurological:     General: No focal deficit present.     Mental Status: He is alert.  Psychiatric:        Mood and Affect: Mood normal.           Assessment & Plan:

## 2019-09-17 NOTE — Assessment & Plan Note (Signed)
Glc and Cr normal Will check his PSA today

## 2019-09-17 NOTE — Assessment & Plan Note (Signed)
Dental referral placed today for [facility]. Information to schedule appointment completed today.   

## 2019-09-18 ENCOUNTER — Encounter: Payer: Self-pay | Admitting: Internal Medicine

## 2019-09-30 ENCOUNTER — Ambulatory Visit (AMBULATORY_SURGERY_CENTER): Payer: Self-pay | Admitting: *Deleted

## 2019-09-30 ENCOUNTER — Other Ambulatory Visit: Payer: Self-pay

## 2019-09-30 VITALS — Temp 97.1°F | Ht 66.0 in | Wt 113.4 lb

## 2019-09-30 DIAGNOSIS — Z1211 Encounter for screening for malignant neoplasm of colon: Secondary | ICD-10-CM

## 2019-09-30 DIAGNOSIS — Z01818 Encounter for other preprocedural examination: Secondary | ICD-10-CM

## 2019-09-30 NOTE — Progress Notes (Signed)
No egg or soy allergy known to patient  No issues with past sedation with any surgeries  or procedures, no intubation problems  No diet pills per patient No home 02 use per patient  No blood thinners per patient  Pt denies issues with constipation  No A fib or A flutter  EMMI video sent to pt's e mail   Pt is self pay- gave suprep sample Lot [id number] exp 10/22  Due to the COVID-19 pandemic we are asking patients to follow these guidelines. Please only bring one care partner. Please be aware that your care partner may wait in the car in the parking lot or if they feel like they will be too hot to wait in the car, they may wait in the lobby on the 4th floor. All care partners are required to wear a mask the entire time (we do not have any that we can provide them), they need to practice social distancing, and we will do a Covid check for all patient's and care partners when you arrive. Also we will check their temperature and your temperature. If the care partner waits in their car they need to stay in the parking lot the entire time and we will call them on their cell phone when the patient is ready for discharge so they can bring the car to the front of the building. Also all patient's will need to wear a mask into building.

## 2019-10-14 ENCOUNTER — Encounter: Payer: Self-pay | Admitting: Internal Medicine

## 2019-10-25 ENCOUNTER — Other Ambulatory Visit: Payer: Self-pay | Admitting: Infectious Diseases

## 2019-10-25 DIAGNOSIS — B20 Human immunodeficiency virus [HIV] disease: Secondary | ICD-10-CM

## 2019-11-05 ENCOUNTER — Encounter: Payer: Self-pay | Admitting: Internal Medicine

## 2019-11-13 ENCOUNTER — Other Ambulatory Visit: Payer: Self-pay

## 2019-11-13 ENCOUNTER — Ambulatory Visit (INDEPENDENT_AMBULATORY_CARE_PROVIDER_SITE_OTHER): Payer: Self-pay

## 2019-11-13 ENCOUNTER — Other Ambulatory Visit: Payer: Self-pay | Admitting: Internal Medicine

## 2019-11-13 DIAGNOSIS — Z1159 Encounter for screening for other viral diseases: Secondary | ICD-10-CM

## 2019-11-14 ENCOUNTER — Telehealth: Payer: Self-pay

## 2019-11-14 LAB — SARS CORONAVIRUS 2 (TAT 6-24 HRS): SARS Coronavirus 2: NEGATIVE

## 2019-11-14 NOTE — Telephone Encounter (Signed)
Patient was rescheduled from Dr. Marla Roe schedule for tomorrow. He is requesting new instructions mailed to him for his new procedure date.

## 2019-11-15 ENCOUNTER — Encounter: Payer: Self-pay | Admitting: Internal Medicine

## 2019-11-15 NOTE — Telephone Encounter (Signed)
New suprep instructions mailed to patient.

## 2019-12-09 ENCOUNTER — Telehealth: Payer: Self-pay | Admitting: Internal Medicine

## 2019-12-09 NOTE — Telephone Encounter (Signed)
Hi Dr. Carlean Purl, this pt just rescheduled his colonoscopy that was scheduled for tomorrow with you. He called stating that he missed his Covid test on 4/1 because he thought that he did not need another one. He stated that last time we rescheduled his procedure and he had the test then. I put him on hold to ask if an exception could be made. However, when I picked up the line again he told me that he was eating a sandwich. His procedure has been rescheduled to 01/10/20. Thank you.

## 2019-12-10 ENCOUNTER — Encounter: Payer: Self-pay | Admitting: Internal Medicine

## 2019-12-10 NOTE — Telephone Encounter (Signed)
No charge. 

## 2020-01-04 DIAGNOSIS — Z86010 Personal history of colonic polyps: Secondary | ICD-10-CM

## 2020-01-04 DIAGNOSIS — Z860101 Personal history of adenomatous and serrated colon polyps: Secondary | ICD-10-CM

## 2020-01-04 HISTORY — PX: COLONOSCOPY W/ POLYPECTOMY: SHX1380

## 2020-01-04 HISTORY — DX: Personal history of adenomatous and serrated colon polyps: Z86.0101

## 2020-01-04 HISTORY — DX: Personal history of colonic polyps: Z86.010

## 2020-01-08 ENCOUNTER — Other Ambulatory Visit: Payer: Self-pay | Admitting: Internal Medicine

## 2020-01-08 ENCOUNTER — Ambulatory Visit (INDEPENDENT_AMBULATORY_CARE_PROVIDER_SITE_OTHER): Payer: Self-pay

## 2020-01-08 DIAGNOSIS — Z1159 Encounter for screening for other viral diseases: Secondary | ICD-10-CM

## 2020-01-08 LAB — SARS CORONAVIRUS 2 (TAT 6-24 HRS): SARS Coronavirus 2: NEGATIVE

## 2020-01-10 ENCOUNTER — Ambulatory Visit (AMBULATORY_SURGERY_CENTER): Payer: Self-pay | Admitting: Internal Medicine

## 2020-01-10 ENCOUNTER — Encounter: Payer: Self-pay | Admitting: Internal Medicine

## 2020-01-10 ENCOUNTER — Other Ambulatory Visit: Payer: Self-pay

## 2020-01-10 VITALS — BP 125/93 | HR 70 | Temp 97.1°F | Resp 23 | Ht 66.0 in | Wt 113.0 lb

## 2020-01-10 DIAGNOSIS — D128 Benign neoplasm of rectum: Secondary | ICD-10-CM

## 2020-01-10 DIAGNOSIS — Z1211 Encounter for screening for malignant neoplasm of colon: Secondary | ICD-10-CM

## 2020-01-10 MED ORDER — SODIUM CHLORIDE 0.9 % IV SOLN
500.0000 mL | INTRAVENOUS | Status: DC
Start: 2020-01-10 — End: 2020-01-10

## 2020-01-10 NOTE — Patient Instructions (Addendum)
I found and removed one small polyp.  I will let you know pathology results and when to have another routine colonoscopy by mail and/or My Chart.  You also have diverticulosis - thickened muscle rings and pouches in the colon wall. Please read the handout about this condition.  Hemorrhoids also seen.  I appreciate the opportunity to care for you. Gatha Mayer, MD, [facility]  Handouts provided on polyps, diverticulosis and hemorrhoids.   YOU HAD AN ENDOSCOPIC PROCEDURE TODAY AT [facility]:   Refer to the procedure report that was given to you for any specific questions about what was found during the examination.  If the procedure report does not answer your questions, please call your gastroenterologist to clarify.  If you requested that your care partner not be given the details of your procedure findings, then the procedure report has been included in a sealed envelope for you to review at your convenience later.  YOU SHOULD EXPECT: Some feelings of bloating in the abdomen. Passage of more gas than usual.  Walking can help get rid of the air that was put into your GI tract during the procedure and reduce the bloating. If you had a lower endoscopy (such as a colonoscopy or flexible sigmoidoscopy) you may notice spotting of blood in your stool or on the toilet paper. If you underwent a bowel prep for your procedure, you may not have a normal bowel movement for a few days.  Please Note:  You might notice some irritation and congestion in your nose or some drainage.  This is from the oxygen used during your procedure.  There is no need for concern and it should clear up in a day or so.  SYMPTOMS TO REPORT IMMEDIATELY:   Following lower endoscopy (colonoscopy or flexible sigmoidoscopy):  Excessive amounts of blood in the stool  Significant tenderness or worsening of abdominal pains  Swelling of the abdomen that is new, acute  Fever of 100F or higher  For urgent or emergent  issues, a gastroenterologist can be reached at any hour by calling [phone]. Do not use MyChart messaging for urgent concerns.    DIET:  We do recommend a small meal at first, but then you may proceed to your regular diet.  Drink plenty of fluids but you should avoid alcoholic beverages for 24 hours.  ACTIVITY:  You should plan to take it easy for the rest of today and you should NOT DRIVE or use heavy machinery until tomorrow (because of the sedation medicines used during the test).    FOLLOW UP: Our staff will call the number listed on your records 48-72 hours following your procedure to check on you and address any questions or concerns that you may have regarding the information given to you following your procedure. If we do not reach you, we will leave a message.  We will attempt to reach you two times.  During this call, we will ask if you have developed any symptoms of COVID 19. If you develop any symptoms (ie: fever, flu-like symptoms, shortness of breath, cough etc.) before then, please call [phone].  If you test positive for Covid 19 in the 2 weeks post procedure, please call and report this information to [place].    If any biopsies were taken you will be contacted by phone or by letter within the next 1-3 weeks.  Please call us at [phone] if you have not heard about the biopsies in 3 weeks.  SIGNATURES/CONFIDENTIALITY: You and/or your care partner have signed paperwork which will be entered into your electronic medical record.  These signatures attest to the fact that that the information above on your After Visit Summary has been reviewed and is understood.  Full responsibility of the confidentiality of this discharge information lies with you and/or your care-partner.

## 2020-01-10 NOTE — Progress Notes (Signed)
Report given to PACU, vss 

## 2020-01-10 NOTE — Progress Notes (Signed)
Called to room to assist during endoscopic procedure.  Patient ID and intended procedure confirmed with present staff. Received instructions for my participation in the procedure from the performing physician.  

## 2020-01-10 NOTE — Op Note (Signed)
George Boyle: George Boyle Procedure Date: 01/10/2020 4:01 PM MRN: [id number] Endoscopist: Gatha Mayer , MD Age: 54 Referring MD:  Date of Birth: 04/22/1966 Gender: Male Account #: [id number] Procedure:                Colonoscopy Indications:              Screening for colorectal malignant neoplasm, This                            is the patient's first colonoscopy Medicines:                Propofol per Anesthesia, Monitored Anesthesia Care Procedure:                Pre-Anesthesia Assessment:                           - Prior to the procedure, a History and Physical                            was performed, and patient medications and                            allergies were reviewed. The patient's tolerance of                            previous anesthesia was also reviewed. The risks                            and benefits of the procedure and the sedation                            options and risks were discussed with the patient.                            All questions were answered, and informed consent                            was obtained. Prior Anticoagulants: The patient has                            taken no previous anticoagulant or antiplatelet                            agents. ASA Grade Assessment: II - A patient with                            mild systemic disease. After reviewing the risks                            and benefits, the patient was deemed in                            satisfactory condition to undergo the procedure.  After obtaining informed consent, the colonoscope                            was passed under direct vision. Throughout the                            procedure, the patient's blood pressure, pulse, and                            oxygen saturations were monitored continuously. The                            Colonoscope was introduced through the anus and   advanced to the the cecum, identified by                            appendiceal orifice and ileocecal valve. The                            colonoscopy was performed without difficulty. The                            patient tolerated the procedure well. The quality                            of the bowel preparation was good. The bowel                            preparation used was Miralax via split dose                            instruction. The ileocecal valve, appendiceal                            orifice, and rectum were photographed. Scope In: 4:14:31 PM Scope Out: 4:24:43 PM Scope Withdrawal Time: 0 hours 7 minutes 52 seconds  Total Procedure Duration: 0 hours 10 minutes 12 seconds  Findings:                 The perianal and digital rectal examinations were                            normal. Pertinent negatives include normal prostate                            (size, shape, and consistency).                           A 6 mm polyp was found in the proximal rectum. The                            polyp was pedunculated. The polyp was removed with                            a cold  snare. Resection and retrieval were                            complete. Verification of patient identification                            for the specimen was done. Estimated blood loss was                            minimal.                           Multiple diverticula were found in the sigmoid                            colon.                           External and internal hemorrhoids were found.                           The exam was otherwise without abnormality on                            direct and retroflexion views. Complications:            No immediate complications. Estimated Blood Loss:     Estimated blood loss was minimal. Impression:               - One 6 mm polyp in the proximal rectum, removed                            with a cold snare. Resected and retrieved.                            - Diverticulosis in the sigmoid colon.                           - External and internal hemorrhoids.                           - The examination was otherwise normal on direct                            and retroflexion views. Recommendation:           - Patient has a contact number available for                            emergencies. The signs and symptoms of potential                            delayed complications were discussed with the                            patient. Return to normal activities tomorrow.  Written discharge instructions were provided to the                            patient.                           - Resume previous diet.                           - Continue present medications.                           - Repeat colonoscopy is recommended. The                            colonoscopy date will be determined after pathology                            results from today's exam become available for                            review. Gatha Mayer, MD 01/10/2020 4:31:42 PM This report has been signed electronically.

## 2020-01-14 ENCOUNTER — Telehealth: Payer: Self-pay | Admitting: *Deleted

## 2020-01-14 ENCOUNTER — Telehealth: Payer: Self-pay

## 2020-01-14 NOTE — Telephone Encounter (Signed)
Called [phone] and his mailbox was full and unable to leave a message.  Will try to call again this afternoon. maw

## 2020-01-14 NOTE — Telephone Encounter (Signed)
Second attempt, mailbox is full and unable to leave message.

## 2020-01-21 ENCOUNTER — Encounter: Payer: Self-pay | Admitting: Internal Medicine

## 2020-01-28 ENCOUNTER — Telehealth: Payer: Self-pay | Admitting: Pharmacy Technician

## 2020-01-28 NOTE — Telephone Encounter (Addendum)
[facility] Patient Advocate Encounter   Patient is not eligible for [facility] Advancing Access Patient Assistance Program for Atripla.    The patient used their one time enrollment 06/2019 when adap lapsed. He is not eligible to receive more assistance this enrollment lapse until there is a denial and income documents.   Patient knows to call the office with questions or concerns. Tried to call but his voicemail is full. His application is currently pending as of today.   Bartholomew Crews, CPhT Specialty Pharmacy Patient [facility] for Infectious Disease Phone: [phone] Fax: [phone] 01/28/2020 11:15 AM

## 2020-02-07 ENCOUNTER — Encounter: Payer: Self-pay | Admitting: Infectious Diseases

## 2020-06-03 ENCOUNTER — Other Ambulatory Visit: Payer: Self-pay

## 2020-06-18 ENCOUNTER — Encounter: Payer: Self-pay | Admitting: Infectious Diseases

## 2020-06-24 ENCOUNTER — Telehealth: Payer: Self-pay

## 2020-06-24 NOTE — Telephone Encounter (Signed)
Received refill request for Atripla for the patient. I have called to let patient know his RW and UMAP have expired and he will need to come in and renew. Patient voicemail was full and I was unable to leave a voicemail. Darric Plante T Brooks Sailors

## 2020-06-29 ENCOUNTER — Other Ambulatory Visit: Payer: Self-pay

## 2020-06-29 ENCOUNTER — Ambulatory Visit: Payer: Self-pay

## 2020-06-29 ENCOUNTER — Encounter: Payer: Self-pay | Admitting: Infectious Diseases

## 2020-06-29 DIAGNOSIS — Z79899 Other long term (current) drug therapy: Secondary | ICD-10-CM

## 2020-06-29 DIAGNOSIS — B20 Human immunodeficiency virus [HIV] disease: Secondary | ICD-10-CM

## 2020-06-29 DIAGNOSIS — Z113 Encounter for screening for infections with a predominantly sexual mode of transmission: Secondary | ICD-10-CM

## 2020-06-30 LAB — T-HELPER CELL (CD4) - (RCID CLINIC ONLY)
CD4 % Helper T Cell: 30 % — ABNORMAL LOW (ref 33–65)
CD4 T Cell Abs: 400 /uL (ref 400–1790)

## 2020-06-30 LAB — URINE CYTOLOGY ANCILLARY ONLY
Chlamydia: NEGATIVE
Comment: NEGATIVE
Comment: NORMAL
Neisseria Gonorrhea: NEGATIVE

## 2020-07-01 LAB — HIV-1 RNA QUANT-NO REFLEX-BLD
HIV 1 RNA Quant: <20 Copies/mL
HIV-1 RNA Quant, Log: <1.3 Log cps/mL

## 2020-07-01 LAB — CBC
HCT: 43.8 % (ref 38.5–50.0)
Hemoglobin: 14.7 g/dL (ref 13.2–17.1)
MCH: 32.8 pg (ref 27.0–33.0)
MCHC: 33.6 g/dL (ref 32.0–36.0)
MCV: 97.8 fL (ref 80.0–100.0)
MPV: 9.8 fL (ref 7.5–12.5)
Platelets: 297 10*3/uL (ref 140–400)
RBC: 4.48 10*6/uL (ref 4.20–5.80)
RDW: 11.8 % (ref 11.0–15.0)
WBC: 7.3 10*3/uL (ref 3.8–10.8)

## 2020-07-01 LAB — RPR TITER: RPR Titer: 1:2 {titer} — ABNORMAL HIGH

## 2020-07-01 LAB — LIPID PANEL
Cholesterol: 197 mg/dL (ref <200)
HDL: 100 mg/dL (ref >=40)
LDL Cholesterol (Calc): 83 mg/dL (calc)
Non-HDL Cholesterol (Calc): 97 mg/dL (calc) (ref <130)
Total CHOL/HDL Ratio: 2 (calc) (ref <5.0)
Triglycerides: 62 mg/dL (ref <150)

## 2020-07-01 LAB — COMPREHENSIVE METABOLIC PANEL
AG Ratio: 1.3 (calc) (ref 1.0–2.5)
ALT: 15 U/L (ref 9–46)
AST: 23 U/L (ref 10–35)
Albumin: 4.3 g/dL (ref 3.6–5.1)
Alkaline phosphatase (APISO): 101 U/L (ref 35–144)
BUN: 14 mg/dL (ref 7–25)
CO2: 30 mmol/L (ref 20–32)
Calcium: 9.8 mg/dL (ref 8.6–10.3)
Chloride: 110 mmol/L (ref 98–110)
Creat: 0.9 mg/dL (ref 0.70–1.33)
Globulin: 3.2 g/dL (calc) (ref 1.9–3.7)
Glucose, Bld: 91 mg/dL (ref 65–99)
Potassium: 4.6 mmol/L (ref 3.5–5.3)
Sodium: 142 mmol/L (ref 135–146)
Total Bilirubin: 0.3 mg/dL (ref 0.2–1.2)
Total Protein: 7.5 g/dL (ref 6.1–8.1)

## 2020-07-01 LAB — FLUORESCENT TREPONEMAL AB(FTA)-IGG-BLD: Fluorescent Treponemal ABS: REACTIVE — AB

## 2020-07-01 LAB — RPR: RPR Ser Ql: REACTIVE — AB

## 2020-07-16 ENCOUNTER — Ambulatory Visit (INDEPENDENT_AMBULATORY_CARE_PROVIDER_SITE_OTHER): Payer: Self-pay | Admitting: Infectious Diseases

## 2020-07-16 ENCOUNTER — Encounter: Payer: Self-pay | Admitting: Infectious Diseases

## 2020-07-16 ENCOUNTER — Other Ambulatory Visit: Payer: Self-pay

## 2020-07-16 VITALS — BP 139/95 | HR 89 | Wt 106.0 lb

## 2020-07-16 DIAGNOSIS — Z79899 Other long term (current) drug therapy: Secondary | ICD-10-CM

## 2020-07-16 DIAGNOSIS — K089 Disorder of teeth and supporting structures, unspecified: Secondary | ICD-10-CM

## 2020-07-16 DIAGNOSIS — B20 Human immunodeficiency virus [HIV] disease: Secondary | ICD-10-CM

## 2020-07-16 DIAGNOSIS — B182 Chronic viral hepatitis C: Secondary | ICD-10-CM

## 2020-07-16 DIAGNOSIS — Z113 Encounter for screening for infections with a predominantly sexual mode of transmission: Secondary | ICD-10-CM

## 2020-07-16 DIAGNOSIS — I10 Essential (primary) hypertension: Secondary | ICD-10-CM

## 2020-07-16 DIAGNOSIS — Z23 Encounter for immunization: Secondary | ICD-10-CM

## 2020-07-16 NOTE — Assessment & Plan Note (Signed)
Will repeat his u/s due to presence of F3.

## 2020-07-16 NOTE — Progress Notes (Signed)
   Subjective:    Patient ID: George Boyle, male    DOB: 23-Dec-1965, 54 y.o.   MRN: [id number]  HPI 54yo M with HIV+ diagnosed in 2000. Was prev in [place] for care, on atripla.  Previous Hep B immune. Had anal pap 10-2013 that was (-).  Had Hep C- 1band was treated with Zapatier (his duration of rx is unclear- possibly only 1 month). F2/3. He had f/u Hep C rna 04-25-16 (-).   August 2018 he was changed back to [place] at his request, from [place] .  No problems with meds. If he has gap in rx, he has funny dreams, otherwise none. Feels well.   HIV 1 RNA Quant  Date Value  06/29/2020 <20 Copies/mL  08/26/2019 <20 DETECTED copies/mL (A)  02/26/2018 48 copies/mL (H)   CD4 T Cell Abs (/uL)  Date Value  06/29/2020 400  08/26/2019 743  02/26/2018 480   Has had COVID vax. Needs booster and flu vax.   Health Maintenance  Topic Date Due  . TETANUS/TDAP  Never done  . INFLUENZA VACCINE  04/05/2020  . COLONOSCOPY  01/10/2027  . COVID-19 Vaccine  Completed  . Hepatitis C Screening  Completed  . HIV Screening  Completed     Review of Systems  Constitutional: Negative for appetite change, chills, fever and unexpected weight change.  Respiratory: Negative for cough and shortness of breath.   Gastrointestinal: Negative for constipation and diarrhea.  Genitourinary: Negative for difficulty urinating.  Psychiatric/Behavioral: Negative for sleep disturbance.       Objective:   Physical Exam Vitals reviewed.  Constitutional:      Appearance: Normal appearance.  HENT:     Mouth/Throat:     Mouth: Mucous membranes are moist.     Dentition: Abnormal dentition. Dental caries present.     Pharynx: No oropharyngeal exudate.  Eyes:     Extraocular Movements: Extraocular movements intact.     Pupils: Pupils are equal, round, and reactive to light.  Cardiovascular:     Rate and Rhythm: Normal rate and regular rhythm.  Pulmonary:     Effort: Pulmonary effort is normal.       Breath sounds: Normal breath sounds.  Abdominal:     General: Bowel sounds are normal. There is no distension.     Palpations: Abdomen is soft.     Tenderness: There is no abdominal tenderness.  Musculoskeletal:        General: Normal range of motion.     Cervical back: Normal range of motion and neck supple.     Right lower leg: No edema.     Left lower leg: No edema.  Neurological:     General: No focal deficit present.     Mental Status: He is alert.  Psychiatric:        Mood and Affect: Mood normal.           Assessment & Plan:

## 2020-07-16 NOTE — Addendum Note (Signed)
Addended by: Lucie Leather D on: 07/16/2020 05:01 PM   Modules accepted: Orders

## 2020-07-16 NOTE — Assessment & Plan Note (Addendum)
He is doing well We reviewed his labs Will give him PCV23 and influenza today Offered/refused condoms.  Will see him back in 9 monhts with labs

## 2020-07-16 NOTE — Assessment & Plan Note (Signed)
He attributes to rushing to get here today.  Will continue to watch.

## 2020-07-16 NOTE — Assessment & Plan Note (Signed)
Dental referral placed today for [facility]. Information to schedule appointment completed today.   

## 2020-07-20 ENCOUNTER — Ambulatory Visit (HOSPITAL_COMMUNITY): Payer: Self-pay

## 2020-08-12 ENCOUNTER — Other Ambulatory Visit: Payer: Self-pay | Admitting: Infectious Diseases

## 2020-08-12 DIAGNOSIS — B20 Human immunodeficiency virus [HIV] disease: Secondary | ICD-10-CM

## 2020-08-17 ENCOUNTER — Other Ambulatory Visit: Payer: Self-pay | Admitting: Infectious Diseases

## 2020-08-17 DIAGNOSIS — B182 Chronic viral hepatitis C: Secondary | ICD-10-CM

## 2020-09-14 ENCOUNTER — Other Ambulatory Visit: Payer: Self-pay

## 2021-03-18 ENCOUNTER — Telehealth: Payer: Self-pay

## 2021-03-18 ENCOUNTER — Ambulatory Visit: Payer: Self-pay

## 2021-03-18 ENCOUNTER — Other Ambulatory Visit: Payer: Self-pay

## 2021-03-18 NOTE — Telephone Encounter (Signed)
Received call from Financial Counselor stating patient is out of medication. Has been off medication for two weeks.Would like to know if refills could be sent to [facility]. ADAP has expired and will need medication assistance. Is scheduled to follow up with [facility] [place] 7/18 with labs same day due to work schedule.  Leatrice Jewels, RMA

## 2021-03-19 ENCOUNTER — Encounter: Payer: Self-pay | Admitting: Family

## 2021-03-19 ENCOUNTER — Ambulatory Visit: Payer: Self-pay

## 2021-03-22 ENCOUNTER — Other Ambulatory Visit: Payer: Self-pay

## 2021-03-22 ENCOUNTER — Encounter: Payer: Self-pay | Admitting: Family

## 2021-03-22 ENCOUNTER — Telehealth: Payer: Self-pay

## 2021-03-22 ENCOUNTER — Other Ambulatory Visit (HOSPITAL_COMMUNITY): Payer: Self-pay

## 2021-03-22 ENCOUNTER — Ambulatory Visit (INDEPENDENT_AMBULATORY_CARE_PROVIDER_SITE_OTHER): Payer: Self-pay | Admitting: Family

## 2021-03-22 VITALS — BP 112/79 | HR 101 | Resp 16 | Ht 66.0 in | Wt 107.0 lb

## 2021-03-22 DIAGNOSIS — B20 Human immunodeficiency virus [HIV] disease: Secondary | ICD-10-CM

## 2021-03-22 DIAGNOSIS — Z0000 Encounter for general adult medical examination without abnormal findings: Secondary | ICD-10-CM | POA: Insufficient documentation

## 2021-03-22 DIAGNOSIS — Z113 Encounter for screening for infections with a predominantly sexual mode of transmission: Secondary | ICD-10-CM

## 2021-03-22 MED ORDER — BICTEGRAVIR-EMTRICITAB-TENOFOV 50-200-25 MG PO TABS: 1.0000 | ORAL_TABLET | Freq: Every day | ORAL | 0 refills | Status: DC

## 2021-03-22 NOTE — Assessment & Plan Note (Signed)
Mr. George Boyle generally has well-controlled HIV disease and tolerance to his ART regimen of Atripla however recently lapsed secondary to financial assistance.  No signs/symptoms of opportunistic infection or progressive HIV.  He renewed financial assistance on 7/15 and it remains in process.  Unable to get a Atripla and will provide 1 month of Biktarvy.  Advised to contact clinic to determine if he would like to stay on Atripla or transition to [place].  Check lab work today.  Biktarvy samples documented in pharmacy log.  Plan for follow-up in 6 or 9 months with Dr. Johnnye Sima.

## 2021-03-22 NOTE — Patient Instructions (Signed)
Nice to meet you.  Start taking the new medication daily.  We will check your lab work today.  Let us know once approved which medication you would like.  Plan for follow up in either 6 or 9 months or sooner if needed with Dr. Johnnye Sima or Marya Amsler.  Have a great day and stay safe!

## 2021-03-22 NOTE — Telephone Encounter (Signed)
Medication Samples have been provided to the patient.  Drug name: Biktarvy        Strength: 50/200/25 mg       Qty: 28   LOT: [id number]   Exp.Date: 06/24  Dosing instructions: Take one tablet by mouth once daily.   Ileene Patrick, [place] [facility] Patient [facility] Phone: [phone] Fax:  [phone]

## 2021-03-22 NOTE — Assessment & Plan Note (Signed)
   Discussed importance of safe sexual practice to reduce risk of STI.  Condoms declined.  Declines tetanus vaccination.  Due for routine dental care and may need referral to [facility].

## 2021-03-22 NOTE — Progress Notes (Signed)
Patient ID: George Boyle, male    DOB: 1966/07/02, 55 y.o.   MRN: [id number]   Subjective:    Chief Complaint  Patient presents with   Follow-up    George Boyle was supposed to work on refills. Also has boil on buttocks.     HPI:  George Boyle is a 55 y.o. male with HIV disease last seen on 07/16/2020 by Dr. Johnnye Boyle with well-controlled virus and good adherence and tolerance to his ART regimen of Atripla.  Viral load was undetectable with CD4 count of 400.  Here today for routine follow-up.  George Boyle has been out of medication for approximately 2 weeks secondary to running out and needing to renew his financial assistance.  Overall feeling well today but does have concern for a possible boil located near his gluteal fold which is tender and without drainage at this point.  Previously had them incised and drained. Denies fevers, chills, night sweats, headaches, changes in vision, neck pain/stiffness, nausea, diarrhea, vomiting, or rashes.  George Boyle we will renew his financial assistance today.  Denies feelings of being down, depressed, or hopeless recently.  His mother did fall recently and broke her hip and is caring for her and he lost a close family member.  He has good support around him and is grieving appropriately.  Drinks alcohol on occasion with no recreational or illicit drug use and approximately 1/2 pack of cigarettes per day.  Condoms offered and declined.  Due for routine dental care.  Healthcare maintenance due includes tetanus.  No Known Allergies    Outpatient Medications Prior to Visit  Medication Sig Dispense Refill   efavirenz-emtricitabine-tenofovir (ATRIPLA) 600-200-300 MG tablet TAKE 1 TABLET BY MOUTH AT BEDTIME 30 tablet 5   ibuprofen (ADVIL,MOTRIN) 200 MG tablet Take 800 mg by mouth every 6 (six) hours as needed for mild pain.     No facility-administered medications prior to visit.     Past Medical History:  Diagnosis Date   Hepatitis C     HIV disease ([place])    Hx of adenomatous polyp of colon 01/2020   repeat about 2028     Past Surgical History:  Procedure Laterality Date   COLONOSCOPY W/ POLYPECTOMY  57/8469   UMBILICAL HERNIA REPAIR        Review of Systems  Constitutional:  Negative for appetite change, chills, fatigue, fever and unexpected weight change.  Eyes:  Negative for visual disturbance.  Respiratory:  Negative for cough, chest tightness, shortness of breath and wheezing.   Cardiovascular:  Negative for chest pain and leg swelling.  Gastrointestinal:  Negative for abdominal pain, constipation, diarrhea, nausea and vomiting.  Genitourinary:  Negative for dysuria, flank pain, frequency, genital sores, hematuria and urgency.  Skin:  Negative for rash.  Allergic/Immunologic: Negative for immunocompromised state.  Neurological:  Negative for dizziness and headaches.     Objective:    BP 112/79   Pulse (!) 101   Resp 16   Ht 5\' 6"  (1.676 m)   Wt 107 lb (48.5 kg)   SpO2 98%   BMI 17.27 kg/m  Nursing note and vital signs reviewed.  Physical Exam Constitutional:      General: He is not in acute distress.    Appearance: He is well-developed and underweight.  Eyes:     Conjunctiva/sclera: Conjunctivae normal.  Cardiovascular:     Rate and Rhythm: Normal rate and regular rhythm.     Heart sounds: Normal heart sounds. No murmur heard.   No  friction rub. No gallop.  Pulmonary:     Effort: Pulmonary effort is normal. No respiratory distress.     Breath sounds: Normal breath sounds. No wheezing or rales.  Chest:     Chest wall: No tenderness.  Abdominal:     General: Bowel sounds are normal.     Palpations: Abdomen is soft.     Tenderness: There is no abdominal tenderness.  Musculoskeletal:     Cervical back: Neck supple.  Lymphadenopathy:     Cervical: No cervical adenopathy.  Skin:    General: Skin is warm and dry.     Findings: No rash.  Neurological:     Mental Status: He is alert and  oriented to person, place, and time.  Psychiatric:        Behavior: Behavior normal.        Thought Content: Thought content normal.        Judgment: Judgment normal.     Depression screen [facility] 2/9 07/16/2020 03/05/2018 07/24/2017 04/25/2016 06/15/2015  Decreased Interest 0 0 0 0 0  Down, Depressed, Hopeless 0 0 0 0 0  PHQ - 2 Score 0 0 0 0 0       Assessment & Plan:    Patient Active Problem List   Diagnosis Date Noted   Healthcare maintenance 03/22/2021   Poor dentition 09/17/2019   Frequency of urination 09/17/2019   Condyloma 03/05/2018   Syphilis 07/24/2017   Hepatitis C without hepatic coma 06/15/2015   Essential hypertension, benign 06/15/2015   Hemorrhoid 10/28/2013   TOBACCO ABUSE 11/15/2010   HIV disease ([place]) 06/07/2010     Problem List Items Addressed This Visit       Other   HIV disease ([place]) - Primary    George Boyle generally has well-controlled HIV disease and tolerance to his ART regimen of Atripla however recently lapsed secondary to financial assistance.  No signs/symptoms of opportunistic infection or progressive HIV.  He renewed financial assistance on 7/15 and it remains in process.  Unable to get a Atripla and will provide 1 month of Biktarvy.  Advised to contact clinic to determine if he would like to stay on Atripla or transition to [place].  Check lab work today.  Biktarvy samples documented in pharmacy log.  Plan for follow-up in 6 or 9 months with Dr. Johnnye Boyle.       Relevant Medications   bictegravir-emtricitabine-tenofovir AF (BIKTARVY) 50-200-25 MG TABS tablet   Other Relevant Orders   COMPLETE METABOLIC PANEL WITH GFR   T-helper cell (CD4)- ([facility] clinic only)   HIV-1 RNA quant-no reflex-bld   Healthcare maintenance    Discussed importance of safe sexual practice to reduce risk of STI.  Condoms declined. Declines tetanus vaccination. Due for routine dental care and may need referral to [facility] clinic.        Other Visit Diagnoses      Screening for STDs (sexually transmitted diseases)       Relevant Orders   RPR        I am having George Boyle start on bictegravir-emtricitabine-tenofovir AF. I am also having him maintain his ibuprofen and efavirenz-emtricitabine-tenofovir.   Meds ordered this encounter  Medications   bictegravir-emtricitabine-tenofovir AF (BIKTARVY) 50-200-25 MG TABS tablet    Sig: Take 1 tablet by mouth daily.    Dispense:  28 tablet    Refill:  0    Order Specific Question:   Supervising Provider    Answer:   Carlyle Basques [phone]  Follow-up: 6 or 9 months for routine care.    Terri Piedra, MSN, FNP-C Nurse Practitioner [facility] for Infectious Disease [place] number: [phone]

## 2021-03-23 LAB — T-HELPER CELL (CD4) - (RCID CLINIC ONLY)
CD4 % Helper T Cell: 29 % — ABNORMAL LOW (ref 33–65)
CD4 T Cell Abs: 655 /uL (ref 400–1790)

## 2021-03-24 ENCOUNTER — Telehealth: Payer: Self-pay

## 2021-03-24 ENCOUNTER — Other Ambulatory Visit: Payer: Self-pay

## 2021-03-24 LAB — COMPLETE METABOLIC PANEL WITH GFR
AG Ratio: 1 (calc) (ref 1.0–2.5)
ALT: 9 U/L (ref 9–46)
AST: 15 U/L (ref 10–35)
Albumin: 3.9 g/dL (ref 3.6–5.1)
Alkaline phosphatase (APISO): 85 U/L (ref 35–144)
BUN: 13 mg/dL (ref 7–25)
CO2: 28 mmol/L (ref 20–32)
Calcium: 9.4 mg/dL (ref 8.6–10.3)
Chloride: 105 mmol/L (ref 98–110)
Creat: 1.03 mg/dL (ref 0.70–1.30)
Globulin: 4 g/dL (calc) — ABNORMAL HIGH (ref 1.9–3.7)
Glucose, Bld: 101 mg/dL — ABNORMAL HIGH (ref 65–99)
Potassium: 3.9 mmol/L (ref 3.5–5.3)
Sodium: 139 mmol/L (ref 135–146)
Total Bilirubin: 0.4 mg/dL (ref 0.2–1.2)
Total Protein: 7.9 g/dL (ref 6.1–8.1)
eGFR: 87 mL/min/{1.73_m2} (ref >=60)

## 2021-03-24 LAB — RPR: RPR Ser Ql: REACTIVE — AB

## 2021-03-24 LAB — FLUORESCENT TREPONEMAL AB(FTA)-IGG-BLD: Fluorescent Treponemal ABS: REACTIVE — AB

## 2021-03-24 LAB — RPR TITER: RPR Titer: 1:2 {titer} — ABNORMAL HIGH

## 2021-03-24 LAB — HIV-1 RNA QUANT-NO REFLEX-BLD
HIV 1 RNA Quant: 152 Copies/mL — ABNORMAL HIGH
HIV-1 RNA Quant, Log: 2.18 Log cps/mL — ABNORMAL HIGH

## 2021-03-24 MED ORDER — DOXYCYCLINE HYCLATE 100 MG PO TABS: 100.0000 mg | ORAL_TABLET | Freq: Two times a day (BID) | ORAL | 0 refills | Status: DC

## 2021-03-24 NOTE — Addendum Note (Signed)
Addended by: Mauricio Po D on: 03/24/2021 01:45 PM   Modules accepted: Orders

## 2021-03-24 NOTE — Telephone Encounter (Signed)
Patient notified prescription sent to the pharmacy

## 2021-03-24 NOTE — Telephone Encounter (Signed)
Patient called stating at his appointment with George Boyle he discuss having a rectal abscess. Patient is requesting for the antibiotic that was discussed at his appointment for the abscess.  Please advise.

## 2021-03-24 NOTE — Telephone Encounter (Signed)
Doxycycline has been sent to his [facility] on [address] and [address].

## 2021-05-27 ENCOUNTER — Other Ambulatory Visit: Payer: Self-pay

## 2021-05-27 ENCOUNTER — Telehealth: Payer: Self-pay

## 2021-05-27 DIAGNOSIS — B20 Human immunodeficiency virus [HIV] disease: Secondary | ICD-10-CM

## 2021-05-27 MED ORDER — BICTEGRAVIR-EMTRICITAB-TENOFOV 50-200-25 MG PO TABS
1.0000 | ORAL_TABLET | Freq: Every day | ORAL | 2 refills | Status: DC
Start: 2021-05-27 — End: 2021-06-30

## 2021-05-27 NOTE — Telephone Encounter (Signed)
-----   Message from [place] sent at 05/27/2021  9:10 AM EDT ----- Patient called about needing refills, he was unsure of what medication he just switched to because he isnt at home. Told him I would relay the message on getting him refills, ADAP is up to date.

## 2021-05-27 NOTE — Telephone Encounter (Signed)
Patient requested to switch to [facility] from [place].Chart review indicates patient was to notify clinic when wanting to switch; RN sent refills to [facility].   [name] Lorita Officer, RN

## 2021-06-30 ENCOUNTER — Other Ambulatory Visit: Payer: Self-pay

## 2021-06-30 ENCOUNTER — Other Ambulatory Visit: Payer: Self-pay | Admitting: Pharmacist

## 2021-06-30 ENCOUNTER — Telehealth: Payer: Self-pay

## 2021-06-30 DIAGNOSIS — B20 Human immunodeficiency virus [HIV] disease: Secondary | ICD-10-CM

## 2021-06-30 MED ORDER — BICTEGRAVIR-EMTRICITAB-TENOFOV 50-200-25 MG PO TABS: 1.0000 | ORAL_TABLET | Freq: Every day | ORAL | 0 refills | Status: AC

## 2021-06-30 MED ORDER — BICTEGRAVIR-EMTRICITAB-TENOFOV 50-200-25 MG PO TABS: 1.0000 | ORAL_TABLET | Freq: Every day | ORAL | 2 refills | Status: DC

## 2021-06-30 NOTE — Telephone Encounter (Signed)
Patient called stating that he is unable to pick up [place] from [facility] on [address] due to ADAP being expired. (Spoke to [employer] to confirm). Patient renewed ADAP on 03/18/2021. Informed patient I would notify financial here at our office to review and he can come pick up 2 weeks of Biktarvy samples until issue is resolved. Patient verbalized his understanding.   [place], CMA

## 2021-06-30 NOTE — Progress Notes (Signed)
Medication Samples have been provided to the patient.  Drug name: Biktarvy        Strength: 50/200/25 mg       Qty: 14 tablets (2 bottles) LOT: CHYSVB   Exp.Date: 6/24  Dosing instructions: Take one tablet by mouth once daily  The patient has been instructed regarding the correct time, dose, and frequency of taking this medication, including desired effects and most common side effects.   Alfonse Spruce, PharmD, CPP Clinical Pharmacist Practitioner [facility] [place] for [facility] Disease

## 2021-08-16 ENCOUNTER — Other Ambulatory Visit: Payer: Self-pay | Admitting: Infectious Diseases

## 2021-08-16 DIAGNOSIS — B182 Chronic viral hepatitis C: Secondary | ICD-10-CM

## 2021-11-04 ENCOUNTER — Other Ambulatory Visit: Payer: Self-pay

## 2021-11-04 DIAGNOSIS — B20 Human immunodeficiency virus [HIV] disease: Secondary | ICD-10-CM

## 2021-11-04 DIAGNOSIS — Z79899 Other long term (current) drug therapy: Secondary | ICD-10-CM

## 2021-11-04 DIAGNOSIS — Z113 Encounter for screening for infections with a predominantly sexual mode of transmission: Secondary | ICD-10-CM

## 2021-11-08 ENCOUNTER — Ambulatory Visit: Payer: Self-pay

## 2021-11-08 ENCOUNTER — Other Ambulatory Visit: Payer: Self-pay

## 2021-11-08 DIAGNOSIS — Z79899 Other long term (current) drug therapy: Secondary | ICD-10-CM

## 2021-11-08 DIAGNOSIS — Z113 Encounter for screening for infections with a predominantly sexual mode of transmission: Secondary | ICD-10-CM

## 2021-11-08 DIAGNOSIS — B20 Human immunodeficiency virus [HIV] disease: Secondary | ICD-10-CM

## 2021-11-10 LAB — LIPID PANEL
Cholesterol: 177 mg/dL (ref <200)
HDL: 87 mg/dL (ref >=40)
LDL Cholesterol (Calc): 77 mg/dL (calc)
Non-HDL Cholesterol (Calc): 90 mg/dL (calc) (ref <130)
Total CHOL/HDL Ratio: 2 (calc) (ref <5.0)
Triglycerides: 54 mg/dL (ref <150)

## 2021-11-10 LAB — CBC WITH DIFFERENTIAL/PLATELET
Absolute Monocytes: 435 cells/uL (ref 200–950)
Basophils Absolute: 19 cells/uL (ref 0–200)
Basophils Relative: 0.3 %
Eosinophils Absolute: 70 cells/uL (ref 15–500)
Eosinophils Relative: 1.1 %
HCT: 43.4 % (ref 38.5–50.0)
Hemoglobin: 14.6 g/dL (ref 13.2–17.1)
Lymphs Abs: 1069 cells/uL (ref 850–3900)
MCH: 30.7 pg (ref 27.0–33.0)
MCHC: 33.6 g/dL (ref 32.0–36.0)
MCV: 91.4 fL (ref 80.0–100.0)
MPV: 10.1 fL (ref 7.5–12.5)
Monocytes Relative: 6.8 %
Neutro Abs: 4806 cells/uL (ref 1500–7800)
Neutrophils Relative %: 75.1 %
Platelets: 275 10*3/uL (ref 140–400)
RBC: 4.75 10*6/uL (ref 4.20–5.80)
RDW: 13.6 % (ref 11.0–15.0)
Total Lymphocyte: 16.7 %
WBC: 6.4 10*3/uL (ref 3.8–10.8)

## 2021-11-10 LAB — COMPLETE METABOLIC PANEL WITH GFR
AG Ratio: 1.2 (calc) (ref 1.0–2.5)
ALT: 13 U/L (ref 9–46)
AST: 17 U/L (ref 10–35)
Albumin: 4.1 g/dL (ref 3.6–5.1)
Alkaline phosphatase (APISO): 64 U/L (ref 35–144)
BUN: 14 mg/dL (ref 7–25)
CO2: 30 mmol/L (ref 20–32)
Calcium: 9.2 mg/dL (ref 8.6–10.3)
Chloride: 105 mmol/L (ref 98–110)
Creat: 0.75 mg/dL (ref 0.70–1.30)
Globulin: 3.3 g/dL (calc) (ref 1.9–3.7)
Glucose, Bld: 116 mg/dL — ABNORMAL HIGH (ref 65–99)
Potassium: 4.1 mmol/L (ref 3.5–5.3)
Sodium: 141 mmol/L (ref 135–146)
Total Bilirubin: 0.4 mg/dL (ref 0.2–1.2)
Total Protein: 7.4 g/dL (ref 6.1–8.1)
eGFR: 107 mL/min/{1.73_m2} (ref >=60)

## 2021-11-10 LAB — T-HELPER CELLS (CD4) COUNT (NOT AT ARMC)
Absolute CD4: 304 cells/uL — ABNORMAL LOW (ref 490–1740)
CD4 T Helper %: 25 % — ABNORMAL LOW (ref 30–61)
Total lymphocyte count: 1199 cells/uL (ref 850–3900)

## 2021-11-10 LAB — FLUORESCENT TREPONEMAL AB(FTA)-IGG-BLD: Fluorescent Treponemal ABS: REACTIVE — AB

## 2021-11-10 LAB — RPR: RPR Ser Ql: REACTIVE — AB

## 2021-11-10 LAB — C. TRACHOMATIS/N. GONORRHOEAE RNA
C. trachomatis RNA, TMA: NOT DETECTED
N. gonorrhoeae RNA, TMA: NOT DETECTED

## 2021-11-10 LAB — HIV-1 RNA QUANT-NO REFLEX-BLD
HIV 1 RNA Quant: 293 Copies/mL — ABNORMAL HIGH
HIV-1 RNA Quant, Log: 2.47 Log cps/mL — ABNORMAL HIGH

## 2021-11-10 LAB — RPR TITER: RPR Titer: 1:2 {titer} — ABNORMAL HIGH

## 2021-11-22 ENCOUNTER — Encounter: Payer: Self-pay | Admitting: Family

## 2022-01-24 ENCOUNTER — Other Ambulatory Visit: Payer: Self-pay

## 2022-01-24 ENCOUNTER — Ambulatory Visit (INDEPENDENT_AMBULATORY_CARE_PROVIDER_SITE_OTHER): Payer: Self-pay | Admitting: Family

## 2022-01-24 ENCOUNTER — Encounter: Payer: Self-pay | Admitting: Family

## 2022-01-24 VITALS — BP 163/106 | HR 75 | Temp 98.4°F | Ht 68.0 in | Wt 110.0 lb

## 2022-01-24 DIAGNOSIS — Z0000 Encounter for general adult medical examination without abnormal findings: Secondary | ICD-10-CM

## 2022-01-24 DIAGNOSIS — B20 Human immunodeficiency virus [HIV] disease: Secondary | ICD-10-CM

## 2022-01-24 MED ORDER — BICTEGRAVIR-EMTRICITAB-TENOFOV 50-200-25 MG PO TABS: 1.0000 | ORAL_TABLET | Freq: Every day | ORAL | 5 refills | Status: DC

## 2022-01-24 NOTE — Progress Notes (Signed)
Brief Narrative   Patient ID: George Boyle, male    DOB: 1966/03/26, 56 y.o.   MRN: [id number]  George Boyle is a 56 y/o AA male diagnosed with HIV diseas ein 2000 with risk factor of MSM. Initial CD4 and viral load unavailable. Genotype from November 2015 with no medication resistance patterns. No history of opportunistic infection. Previous ART history of Atripla. Now on Biktarvy.   Subjective:    Chief Complaint  Patient presents with   Follow-up    B20    HPI:  George Boyle is a 56 y.o. male last seen on 03/22/2021 with well-controlled virus and good adherence and tolerance to his ART regimen of Atripla and was transition to [employer].  Viral load at the time was 152 with CD4 count of 655.  Most recent blood work completed on 11/08/2021 with viral load of 293 and CD4 count of 304.  Kidney function, liver function, electrolytes within normal ranges.  RPR titer was serofast at 1: 2.  Negative for gonorrhea and chlamydia.  Here today for routine follow-up.  George Boyle has been doing well with his Biktarvy. He was off his medication for about two months due to financial assistance expiration but is now back on track. Feeling well today with no new concerns/complaints. Denies fevers, chills, night sweats, headaches, changes in vision, neck pain/stiffness, nausea, diarrhea, vomiting, lesions or rashes.  George Boyle remains covered by [employer]. Denies feelings of being down, depressed or hopeless recently. No recreational/illicit drug use, tobacco use or alcohol consumption. Condoms offered. Due for routine dental care.   No Known Allergies    Outpatient Medications Prior to Visit  Medication Sig Dispense Refill   ibuprofen (ADVIL,MOTRIN) 200 MG tablet Take 800 mg by mouth every 6 (six) hours as needed for mild pain.     bictegravir-emtricitabine-tenofovir AF (BIKTARVY) 50-200-25 MG TABS tablet Take 1 tablet by mouth daily. 30 tablet 2   doxycycline (VIBRA-TABS) 100 MG  tablet Take 1 tablet (100 mg total) by mouth 2 (two) times daily. (Patient not taking: Reported on 01/24/2022) 20 tablet 0   No facility-administered medications prior to visit.     Past Medical History:  Diagnosis Date   Hepatitis C    HIV disease ([place])    Hx of adenomatous polyp of colon 01/2020   repeat about 2028     Past Surgical History:  Procedure Laterality Date   COLONOSCOPY W/ POLYPECTOMY  59/5638   UMBILICAL HERNIA REPAIR        Review of Systems  Constitutional:  Negative for appetite change, chills, fatigue, fever and unexpected weight change.  Eyes:  Negative for visual disturbance.  Respiratory:  Negative for cough, chest tightness, shortness of breath and wheezing.   Cardiovascular:  Negative for chest pain and leg swelling.  Gastrointestinal:  Negative for abdominal pain, constipation, diarrhea, nausea and vomiting.  Genitourinary:  Negative for dysuria, flank pain, frequency, genital sores, hematuria and urgency.  Skin:  Negative for rash.  Allergic/Immunologic: Negative for immunocompromised state.  Neurological:  Negative for dizziness and headaches.     Objective:    BP (!) 163/106   Pulse 75   Temp 98.4 F (36.9 C) (Temporal)   Ht '5\' 8"'$  (1.727 m)   Wt 110 lb (49.9 kg)   BMI 16.73 kg/m  Nursing note and vital signs reviewed.  Physical Exam Constitutional:      General: He is not in acute distress.    Appearance: He is well-developed.  Eyes:  Conjunctiva/sclera: Conjunctivae normal.  Cardiovascular:     Rate and Rhythm: Normal rate and regular rhythm.     Heart sounds: Normal heart sounds. No murmur heard.   No friction rub. No gallop.  Pulmonary:     Effort: Pulmonary effort is normal. No respiratory distress.     Breath sounds: Normal breath sounds. No wheezing or rales.  Chest:     Chest wall: No tenderness.  Abdominal:     General: Bowel sounds are normal.     Palpations: Abdomen is soft.     Tenderness: There is no abdominal  tenderness.  Musculoskeletal:     Cervical back: Neck supple.  Lymphadenopathy:     Cervical: No cervical adenopathy.  Skin:    General: Skin is warm and dry.     Findings: No rash.  Neurological:     Mental Status: He is alert and oriented to person, place, and time.  Psychiatric:        Behavior: Behavior normal.        Thought Content: Thought content normal.        Judgment: Judgment normal.        01/24/2022    2:20 PM 07/16/2020    4:35 PM 03/05/2018   10:52 AM 07/24/2017   10:45 AM 04/25/2016   11:30 AM  Depression screen PHQ 2/9  Decreased Interest 0 0 0 0 0  Down, Depressed, Hopeless 0 0 0 0 0  PHQ - 2 Score 0 0 0 0 0       Assessment & Plan:    Patient Active Problem List   Diagnosis Date Noted   Healthcare maintenance 03/22/2021   Poor dentition 09/17/2019   Frequency of urination 09/17/2019   Condyloma 03/05/2018   Syphilis 07/24/2017   Hepatitis C without hepatic coma 06/15/2015   Essential hypertension, benign 06/15/2015   Hemorrhoid 10/28/2013   TOBACCO ABUSE 11/15/2010   HIV disease ([place]) 06/07/2010     Problem List Items Addressed This Visit       Other   HIV disease ([place]) - Primary    George Boyle appears to have adequately controlled virus with good adherence and tolerance to Biktarvy. Reviewed previous lab work and discussed plan of care. Need to renew financial assistance after July 1st. Check viral load today. Continue current dose of Biktarvy. Plan for follow up in 6 months or sooner if needed.        Relevant Medications   bictegravir-emtricitabine-tenofovir AF (BIKTARVY) 50-200-25 MG TABS tablet   Other Relevant Orders   HIV-1 RNA quant-no reflex-bld   Healthcare maintenance    Discussed importance of safe sexual practice and condom use. Condoms offered.  Referral placed to [facility] clinic for routine care.  Reviewed and discussed vaccinations.         I have discontinued George Boyle's doxycycline. I am also having  him maintain his ibuprofen and bictegravir-emtricitabine-tenofovir AF.   Meds ordered this encounter  Medications   bictegravir-emtricitabine-tenofovir AF (BIKTARVY) 50-200-25 MG TABS tablet    Sig: Take 1 tablet by mouth daily.    Dispense:  30 tablet    Refill:  5    ADAP CASE #[id number]  AUTH # [id number]    Order Specific Question:   Supervising Provider    Answer:   Carlyle Basques [[id number]]     Follow-up: Return in about 6 months (around 07/27/2022).   Terri Piedra, MSN, FNP-C Nurse Practitioner [facility] for Infectious Disease [place]  number: [phone]

## 2022-01-24 NOTE — Assessment & Plan Note (Signed)
George Boyle appears to have adequately controlled virus with good adherence and tolerance to Biktarvy. Reviewed previous lab work and discussed plan of care. Need to renew financial assistance after July 1st. Check viral load today. Continue current dose of Biktarvy. Plan for follow up in 6 months or sooner if needed.

## 2022-01-24 NOTE — Patient Instructions (Addendum)
Nice to see you.  We will check your lab work today.  Continue to take your medication daily as prescribed.  Refills have been sent to the pharmacy.  Please call [facility]) to schedule/follow up on your dental care at [phone]  Renew financial assistance after July 1st and before September 1st.    Plan for follow up in 6 months or sooner if needed with lab work on the same day.  Have a great day and stay safe!

## 2022-01-24 NOTE — Assessment & Plan Note (Signed)
   Discussed importance of safe sexual practice and condom use. Condoms offered.   Referral placed to [place] clinic for routine care.   Reviewed and discussed vaccinations.

## 2022-01-26 LAB — HIV-1 RNA QUANT-NO REFLEX-BLD
HIV 1 RNA Quant: NOT DETECTED copies/mL
HIV-1 RNA Quant, Log: NOT DETECTED Log copies/mL

## 2022-01-27 ENCOUNTER — Telehealth: Payer: Self-pay

## 2022-01-27 NOTE — Telephone Encounter (Signed)
-----   Message from [place], [place] sent at 01/27/2022  4:22 PM EDT ----- Please inform Mr. Cephas that his viral load is undetectable. Thanks.

## 2022-01-27 NOTE — Telephone Encounter (Signed)
Patient aware of results.   George Boyle P Shakita Keir, CMA  

## 2022-02-06 ENCOUNTER — Encounter: Payer: Self-pay | Admitting: Infectious Diseases

## 2022-06-22 ENCOUNTER — Other Ambulatory Visit: Payer: Self-pay | Admitting: Family

## 2022-06-22 DIAGNOSIS — B20 Human immunodeficiency virus [HIV] disease: Secondary | ICD-10-CM

## 2022-07-11 ENCOUNTER — Ambulatory Visit: Payer: Self-pay

## 2022-07-11 ENCOUNTER — Ambulatory Visit: Payer: Self-pay | Admitting: Family

## 2023-01-25 ENCOUNTER — Other Ambulatory Visit: Payer: Self-pay | Admitting: Family

## 2023-01-25 DIAGNOSIS — B20 Human immunodeficiency virus [HIV] disease: Secondary | ICD-10-CM

## 2023-02-13 NOTE — Progress Notes (Signed)
Inputs: ASCVD 14% Sex: Male Race: African American Age: 57 Total Cholesterol (mg/dL) 865 HDL Cholesterol (mg/dL) 87 LDL Cholesterol (mg/dL) 77 Systolic Blood Pressure (mm Hg) 163 Diastolic Blood Pressure (mm Hg) 106 Diabetes: No Smoker: Current Treatment for Hypertension: No Aspirin Therapy: No Statin: No  Sandie Ano, RN

## 2023-02-20 ENCOUNTER — Encounter: Payer: Self-pay | Admitting: Family

## 2023-02-20 ENCOUNTER — Other Ambulatory Visit: Payer: Self-pay

## 2023-02-20 ENCOUNTER — Ambulatory Visit (INDEPENDENT_AMBULATORY_CARE_PROVIDER_SITE_OTHER): Payer: Self-pay | Admitting: Family

## 2023-02-20 VITALS — BP 160/90 | HR 84 | Resp 16 | Ht 68.0 in | Wt 106.0 lb

## 2023-02-20 DIAGNOSIS — F17200 Nicotine dependence, unspecified, uncomplicated: Secondary | ICD-10-CM

## 2023-02-20 DIAGNOSIS — R030 Elevated blood-pressure reading, without diagnosis of hypertension: Secondary | ICD-10-CM | POA: Insufficient documentation

## 2023-02-20 DIAGNOSIS — Z79899 Other long term (current) drug therapy: Secondary | ICD-10-CM

## 2023-02-20 DIAGNOSIS — B182 Chronic viral hepatitis C: Secondary | ICD-10-CM

## 2023-02-20 DIAGNOSIS — F17210 Nicotine dependence, cigarettes, uncomplicated: Secondary | ICD-10-CM

## 2023-02-20 DIAGNOSIS — B20 Human immunodeficiency virus [HIV] disease: Secondary | ICD-10-CM

## 2023-02-20 DIAGNOSIS — Z0000 Encounter for general adult medical examination without abnormal findings: Secondary | ICD-10-CM

## 2023-02-20 DIAGNOSIS — Z113 Encounter for screening for infections with a predominantly sexual mode of transmission: Secondary | ICD-10-CM

## 2023-02-20 DIAGNOSIS — I10 Essential (primary) hypertension: Secondary | ICD-10-CM

## 2023-02-20 DIAGNOSIS — K629 Disease of anus and rectum, unspecified: Secondary | ICD-10-CM

## 2023-02-20 DIAGNOSIS — A539 Syphilis, unspecified: Secondary | ICD-10-CM

## 2023-02-20 MED ORDER — BIKTARVY 50-200-25 MG PO TABS
1.0000 | ORAL_TABLET | Freq: Every day | ORAL | 5 refills | Status: DC
Start: 2023-02-20 — End: 2023-06-19

## 2023-02-20 NOTE — Progress Notes (Signed)
Brief Narrative   Patient ID: George Boyle, male    DOB: July 08, 1966, 57 y.o.   MRN: [id number]  George Boyle is a 57 y/o AA male diagnosed with HIV diseas ein 2000 with risk factor of MSM. Initial CD4 and viral load unavailable. Genotype from November 2015 with no medication resistance patterns. No history of opportunistic infection. Previous ART history of Atripla. Now on Biktarvy.   Subjective:    Chief Complaint  Patient presents with   Follow-up    BP high-States he just smoked and had one cup of coffee. No hx of hypertension.   HIV Positive/AIDS    HPI:  George Boyle is a 57 y.o. male with HIV disease last seen on 01/24/2022 with well-controlled virus and good adherence and tolerance to [employer].  Viral load was undetectable with previous CD4 count of 304.  Here today for follow-up.  George Boyle has been doing okay since his last office visit.  Was off medication secondary to insurance coverage for approximately 6 months and now back taking Biktarvy regularly with no adverse side effects or problems taking medication.  Has concerns for an anal lesion that has been going on for approximately 6 months that has occasional bleeding when wiping and has increased in size since he initially noticed it.  Has a history of anal lesion of unclear origin that was surgically removed. Has not attempted any treatments.  Currently covered by [employer] and will need to apply for Medicaid.  Uses tobacco daily.  Condoms and STD testing offered.  Due for meningococcal and tetanus vaccinations as well as routine dental care.  Denies fevers, chills, night sweats, headaches, changes in vision, neck pain/stiffness, nausea, diarrhea, vomiting, or rashes.  No Known Allergies    Outpatient Medications Prior to Visit  Medication Sig Dispense Refill   ibuprofen (ADVIL,MOTRIN) 200 MG tablet Take 800 mg by mouth every 6 (six) hours as needed for mild pain.     bictegravir-emtricitabine-tenofovir  AF (BIKTARVY) 50-200-25 MG TABS tablet TAKE 1 TABLET BY MOUTH DAILY 30 tablet 0   No facility-administered medications prior to visit.     Past Medical History:  Diagnosis Date   Hepatitis C    HIV disease (HCC)    Hx of adenomatous polyp of colon 01/2020   repeat about 2028     Past Surgical History:  Procedure Laterality Date   COLONOSCOPY W/ POLYPECTOMY  01/2020   UMBILICAL HERNIA REPAIR        Review of Systems  Constitutional:  Negative for appetite change, chills, fatigue, fever and unexpected weight change.  Eyes:  Negative for visual disturbance.  Respiratory:  Negative for cough, chest tightness, shortness of breath and wheezing.   Cardiovascular:  Negative for chest pain and leg swelling.  Gastrointestinal:  Negative for abdominal pain, constipation, diarrhea, nausea and vomiting.       Positive for anal lesion.   Genitourinary:  Negative for dysuria, flank pain, frequency, genital sores, hematuria and urgency.  Skin:  Negative for rash.  Allergic/Immunologic: Negative for immunocompromised state.  Neurological:  Negative for dizziness and headaches.      Objective:    BP (!) 160/90 (BP Location: Right Arm, Patient Position: Sitting)   Pulse 84   Resp 16   Ht 5\' 8"  (1.727 m)   Wt 106 lb (48.1 kg)   SpO2 99%   BMI 16.12 kg/m  Nursing note and vital signs reviewed.  Physical Exam Exam conducted with a chaperone present.  Constitutional:  General: He is not in acute distress.    Appearance: He is well-developed.  Cardiovascular:     Rate and Rhythm: Normal rate and regular rhythm.     Heart sounds: Normal heart sounds.  Pulmonary:     Effort: Pulmonary effort is normal.     Breath sounds: Normal breath sounds.  Genitourinary:    Comments: Anal lesion that appears consistent with large condyloma with red, raw base at the the 10 o'clock position of the rectum. Skin:    General: Skin is warm and dry.  Neurological:     Mental Status: He is alert  and oriented to person, place, and time.  Psychiatric:        Mood and Affect: Mood normal.         02/20/2023    9:20 AM 01/24/2022    2:20 PM 07/16/2020    4:35 PM 03/05/2018   10:52 AM 07/24/2017   10:45 AM  Depression screen PHQ 2/9  Decreased Interest 0 0 0 0 0  Down, Depressed, Hopeless 0 0 0 0 0  PHQ - 2 Score 0 0 0 0 0       Assessment & Plan:    Patient Active Problem List   Diagnosis Date Noted   Anal lesion 02/20/2023   Elevated blood pressure reading 02/20/2023   Healthcare maintenance 03/22/2021   Poor dentition 09/17/2019   Frequency of urination 09/17/2019   Condyloma 03/05/2018   Syphilis 07/24/2017   Hepatitis C without hepatic coma 06/15/2015   Essential hypertension, benign 06/15/2015   Hemorrhoid 10/28/2013   TOBACCO ABUSE 11/15/2010   HIV disease (HCC) 06/07/2010     Problem List Items Addressed This Visit       Cardiovascular and Mediastinum   Essential hypertension, benign    Previous history of hypertension and Stage 2 hypertension noted today. No neurological/ophthalmologic signs/symptoms. Encouraged to monitor blood pressure at home at different times throughout the day. If blood pressure remains elevated will need to start medication. Advised to seek emergent care if worst headache of life develops.         Digestive   Hepatitis C without hepatic coma   Relevant Medications   bictegravir-emtricitabine-tenofovir AF (BIKTARVY) 50-200-25 MG TABS tablet   Other Relevant Orders   Hepatitis C RNA quantitative ([facility])   Anal lesion    Anal lesion appears consistent with condyloma although cannot rule out malignancy. Medication not likely to resolve and will refer to General Surgery for further evaluation and intervention.       Relevant Orders   Ambulatory referral to General Surgery     Other   HIV disease [facility]) - Primary    George Boyle has reportedly good adherence and tolerance to [facility] after missing approximately 6 months of  medication.  Reviewed previous lab work and discussed U equals U and importance of staying on medication to reduce risk of disease progression and complications in the future.  Will need to apply for Medicaid prior to [employer] renewal. Continue current dose of Biktarvy. Plan for follow up in 3 months or sooner if needed with lab work on the same day.       Relevant Medications   bictegravir-emtricitabine-tenofovir AF (BIKTARVY) 50-200-25 MG TABS tablet   Other Relevant Orders   COMPLETE METABOLIC PANEL WITH GFR   HIV-1 RNA quant-no reflex-bld   Cytology - PAP   T-helper cells (CD4) count (not at [facility])   TOBACCO ABUSE    George Boyle continues to smoke  tobacco daily and is in the precontemplation stage of quitting and not ready to quit at this time.  Reviewed the importance of tobacco cessation to reduce risk of cardiovascular, respiratory, renal, malignant disease in the future.      Syphilis    Previous RPR titer stable at 1:2. Recheck RPR.       Relevant Medications   bictegravir-emtricitabine-tenofovir AF (BIKTARVY) 50-200-25 MG TABS tablet   Healthcare maintenance    Discussed importance of safe sexual practice and condom use. Condoms and STD testing offered.  Declines vaccinations Colon cancer screening up to date.  Anal pap smear performed for anal cancer screening.       Other Visit Diagnoses     Pharmacologic therapy       Relevant Orders   Lipid panel   Screening for STDs (sexually transmitted diseases)       Relevant Orders   RPR        I am having George Boyle maintain his ibuprofen and Biktarvy.   Meds ordered this encounter  Medications   bictegravir-emtricitabine-tenofovir AF (BIKTARVY) 50-200-25 MG TABS tablet    Sig: Take 1 tablet by mouth daily.    Dispense:  30 tablet    Refill:  5    Order Specific Question:   Supervising Provider    Answer:   Judyann Munson [[id number]]     Follow-up: Return in about 3 months (around 05/23/2023), or if symptoms  worsen or fail to improve.   George Eke, MSN, FNP-C Nurse Practitioner [facility] for Infectious Disease [facility] Main number: [phone]

## 2023-02-20 NOTE — Assessment & Plan Note (Signed)
Previous history of hypertension and Stage 2 hypertension noted today. No neurological/ophthalmologic signs/symptoms. Encouraged to monitor blood pressure at home at different times throughout the day. If blood pressure remains elevated will need to start medication. Advised to seek emergent care if worst headache of life develops.

## 2023-02-20 NOTE — Assessment & Plan Note (Signed)
Mr. Sardo continues to smoke tobacco daily and is in the precontemplation stage of quitting and not ready to quit at this time.  Reviewed the importance of tobacco cessation to reduce risk of cardiovascular, respiratory, renal, malignant disease in the future.

## 2023-02-20 NOTE — Assessment & Plan Note (Signed)
Anal lesion appears consistent with condyloma although cannot rule out malignancy. Medication not likely to resolve and will refer to General Surgery for further evaluation and intervention.

## 2023-02-20 NOTE — Assessment & Plan Note (Signed)
Discussed importance of safe sexual practice and condom use. Condoms and STD testing offered.  Declines vaccinations Colon cancer screening up to date.  Anal pap smear performed for anal cancer screening.

## 2023-02-20 NOTE — Patient Instructions (Addendum)
Nice to see you.  We will check your lab work today.  Continue to take your medication daily as prescribed.  Refills have been sent to the pharmacy.  We will place a referral to General Surgery.   Plan for follow up in 3 months or sooner if needed with lab work on the same day.  Have a great day and stay safe!

## 2023-02-20 NOTE — Assessment & Plan Note (Signed)
Previous RPR titer stable at 1:2. Recheck RPR.

## 2023-02-20 NOTE — Assessment & Plan Note (Addendum)
George Boyle has reportedly good adherence and tolerance to [facility] after missing approximately 6 months of medication.  Reviewed previous lab work and discussed U equals U and importance of staying on medication to reduce risk of disease progression and complications in the future.  Will need to apply for Medicaid prior to ADAP renewal. Continue current dose of Biktarvy. Plan for follow up in 3 months or sooner if needed with lab work on the same day.

## 2023-02-21 LAB — LIPID PANEL
HDL: 107 mg/dL (ref >=40)
LDL Cholesterol (Calc): 60 mg/dL (calc)
Non-HDL Cholesterol (Calc): 75 mg/dL (calc) (ref <130)

## 2023-02-21 LAB — T-HELPER CELLS (CD4) COUNT (NOT AT ARMC)
Absolute CD4: 312 cells/uL — ABNORMAL LOW (ref 490–1740)
CD4 T Helper %: 23 % — ABNORMAL LOW (ref 30–61)

## 2023-02-22 LAB — HEPATITIS C RNA QUANTITATIVE
HCV Quantitative Log: <1.18 log IU/mL
HCV RNA, PCR, QN: <15 IU/mL

## 2023-02-22 LAB — COMPLETE METABOLIC PANEL WITH GFR
AG Ratio: 1.4 (calc) (ref 1.0–2.5)
ALT: 15 U/L (ref 9–46)
AST: 26 U/L (ref 10–35)
Albumin: 4.2 g/dL (ref 3.6–5.1)
Alkaline phosphatase (APISO): 77 U/L (ref 35–144)
BUN: 15 mg/dL (ref 7–25)
CO2: 25 mmol/L (ref 20–32)
Calcium: 9.3 mg/dL (ref 8.6–10.3)
Chloride: 107 mmol/L (ref 98–110)
Creat: 0.95 mg/dL (ref 0.70–1.30)
Globulin: 3 g/dL (calc) (ref 1.9–3.7)
Glucose, Bld: 89 mg/dL (ref 65–99)
Potassium: 4.4 mmol/L (ref 3.5–5.3)
Sodium: 141 mmol/L (ref 135–146)
Total Bilirubin: 0.5 mg/dL (ref 0.2–1.2)
Total Protein: 7.2 g/dL (ref 6.1–8.1)
eGFR: 95 mL/min/{1.73_m2} (ref >=60)

## 2023-02-22 LAB — HIV-1 RNA QUANT-NO REFLEX-BLD
HIV 1 RNA Quant: NOT DETECTED Copies/mL
HIV-1 RNA Quant, Log: NOT DETECTED Log cps/mL

## 2023-02-22 LAB — T-HELPER CELLS (CD4) COUNT (NOT AT ARMC): Total lymphocyte count: 1343 cells/uL (ref 850–3900)

## 2023-02-22 LAB — LIPID PANEL
Cholesterol: 182 mg/dL (ref <200)
Total CHOL/HDL Ratio: 1.7 (calc) (ref <5.0)
Triglycerides: 66 mg/dL (ref <150)

## 2023-02-22 LAB — T PALLIDUM AB: T Pallidum Abs: POSITIVE — AB

## 2023-02-22 LAB — RPR: RPR Ser Ql: REACTIVE — AB

## 2023-02-22 LAB — RPR TITER: RPR Titer: 1:2 {titer} — ABNORMAL HIGH

## 2023-02-28 LAB — CYTOLOGY - PAP
Comment: NEGATIVE
Comment: NEGATIVE
Comment: NEGATIVE
Diagnosis: UNDETERMINED — AB
HPV 16: NEGATIVE
HPV 18 / 45: NEGATIVE
High risk HPV: POSITIVE — AB

## 2023-03-01 ENCOUNTER — Telehealth: Payer: Self-pay | Admitting: Family

## 2023-03-01 DIAGNOSIS — R85619 Unspecified abnormal cytological findings in specimens from anus: Secondary | ICD-10-CM

## 2023-03-01 NOTE — Telephone Encounter (Signed)
Called Mr. Maselli to discuss results of his lab work and was unable to reach him via phone and unable to leave a voicemail secondary to the mailbox being full.  HIV is well controlled with undetectable viral load and CD4 count 312. Kidney fucntion, liver function and electrolytes are normal. 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 Hepatitis C RNA was not detected indicating no infection.   Marcos Eke, NP 03/01/2023 2:18 PM

## 2023-05-01 ENCOUNTER — Telehealth: Payer: Self-pay

## 2023-05-01 NOTE — Telephone Encounter (Signed)
Called pt to get pay stubs to complete financial application.  Mailbox is full.

## 2023-06-19 ENCOUNTER — Encounter: Payer: Self-pay | Admitting: Family

## 2023-06-19 ENCOUNTER — Ambulatory Visit (INDEPENDENT_AMBULATORY_CARE_PROVIDER_SITE_OTHER): Payer: Self-pay | Admitting: Family

## 2023-06-19 ENCOUNTER — Other Ambulatory Visit: Payer: Self-pay

## 2023-06-19 VITALS — BP 140/88 | HR 88 | Temp 98.5°F | Ht 67.0 in | Wt 105.0 lb

## 2023-06-19 DIAGNOSIS — K629 Disease of anus and rectum, unspecified: Secondary | ICD-10-CM

## 2023-06-19 DIAGNOSIS — F17200 Nicotine dependence, unspecified, uncomplicated: Secondary | ICD-10-CM

## 2023-06-19 DIAGNOSIS — A539 Syphilis, unspecified: Secondary | ICD-10-CM

## 2023-06-19 DIAGNOSIS — F17210 Nicotine dependence, cigarettes, uncomplicated: Secondary | ICD-10-CM

## 2023-06-19 DIAGNOSIS — B20 Human immunodeficiency virus [HIV] disease: Secondary | ICD-10-CM

## 2023-06-19 DIAGNOSIS — F4321 Adjustment disorder with depressed mood: Secondary | ICD-10-CM | POA: Insufficient documentation

## 2023-06-19 DIAGNOSIS — Z0000 Encounter for general adult medical examination without abnormal findings: Secondary | ICD-10-CM

## 2023-06-19 MED ORDER — BIKTARVY 50-200-25 MG PO TABS: 1.0000 | ORAL_TABLET | Freq: Every day | ORAL | 5 refills | Status: DC

## 2023-06-19 NOTE — Assessment & Plan Note (Signed)
George Boyle continues to smoke tobacco regularly. Counseled on importance of tobacco cessation to reduce risk of disease in the future. Not ready to quit at this time.

## 2023-06-19 NOTE — Assessment & Plan Note (Signed)
Bradshaw continues to have well controlled virus with good adherence and tolerance to [employer].  Reviewed lab work and discussed plan of care, U equals U, and family planning. Check lab work. Continue current dose of Biktarvy. Plan for follow up in  6 months or sooner if needed with lab work on the same day.

## 2023-06-19 NOTE — Assessment & Plan Note (Signed)
Previous RPR titer of 1:2 appears serofast for the past couple of years. Check RPR for continued monitoring. No treatment indicated at this time.

## 2023-06-19 NOTE — Progress Notes (Signed)
Brief Narrative   Patient ID: George Boyle, male    DOB: 07-29-1966, 57 y.o.   MRN: [id number]  George Boyle is a 57 y/o AA male diagnosed with HIV diseas ein 2000 with risk factor of MSM. Initial CD4 and viral load unavailable. Genotype from November 2015 with no medication resistance patterns. No history of opportunistic infection. Previous ART history of Atripla. Now on Biktarvy.   Subjective:    Chief Complaint  Patient presents with   Follow-up    HPI:  George Boyle is a 57 y.o. male with HIV disease last seen on 02/20/23 with well controlled virus and good adherence and tolerance to Biktarvy. Viral load was undetectable with CD4 count 655.  Kidney function, liver function and electrolytes within normal ranges. Lipid profile with HDL 107, LDL 60, and triglycerides 66. Here today for follow up.   George Boyle has been doing okay since his last office visit and recently experienced the loss of his mother who was in declining health but was unexpected. He is grieving appropriately right now. Continues to take Biktarvy as prescribed with no adverse side effects or problems obtaining medication. Has a question about his referral to have a condyloma removed from his anal area with previous referral placed. Condoms and STD testing offered. Vaccines discussed. Declined dental care referral.   Denies fevers, chills, night sweats, headaches, changes in vision, neck pain/stiffness, nausea, diarrhea, vomiting, lesions or rashes.  Lab Results  Component Value Date   CD4TCELL 23 (L) 02/20/2023   CD4TABS 655 03/22/2021   Lab Results  Component Value Date   HIV1RNAQUANT Not Detected 02/20/2023     No Known Allergies    Outpatient Medications Prior to Visit  Medication Sig Dispense Refill   ibuprofen (ADVIL,MOTRIN) 200 MG tablet Take 800 mg by mouth every 6 (six) hours as needed for mild pain.     bictegravir-emtricitabine-tenofovir AF (BIKTARVY) 50-200-25 MG TABS tablet Take  1 tablet by mouth daily. 30 tablet 5   No facility-administered medications prior to visit.     Past Medical History:  Diagnosis Date   Hepatitis C    HIV disease (HCC)    Hx of adenomatous polyp of colon 01/2020   repeat about 2028     Past Surgical History:  Procedure Laterality Date   COLONOSCOPY W/ POLYPECTOMY  01/2020   UMBILICAL HERNIA REPAIR        Review of Systems  Constitutional:  Negative for appetite change, chills, fatigue, fever and unexpected weight change.  Eyes:  Negative for visual disturbance.  Respiratory:  Negative for cough, chest tightness, shortness of breath and wheezing.   Cardiovascular:  Negative for chest pain and leg swelling.  Gastrointestinal:  Negative for abdominal pain, constipation, diarrhea, nausea and vomiting.  Genitourinary:  Negative for dysuria, flank pain, frequency, genital sores, hematuria and urgency.  Skin:  Negative for rash.  Allergic/Immunologic: Negative for immunocompromised state.  Neurological:  Negative for dizziness and headaches.      Objective:    BP (!) 140/88   Pulse 88   Temp 98.5 F (36.9 C) (Temporal)   Ht 5\' 7"  (1.702 m)   Wt 105 lb (47.6 kg)   SpO2 99%   BMI 16.45 kg/m  Nursing note and vital signs reviewed.  Physical Exam Constitutional:      General: He is not in acute distress.    Appearance: He is well-developed.  Eyes:     Conjunctiva/sclera: Conjunctivae normal.  Cardiovascular:     Rate  and Rhythm: Normal rate and regular rhythm.     Heart sounds: Normal heart sounds. No murmur heard.    No friction rub. No gallop.  Pulmonary:     Effort: Pulmonary effort is normal. No respiratory distress.     Breath sounds: Normal breath sounds. No wheezing or rales.  Chest:     Chest wall: No tenderness.  Abdominal:     General: Bowel sounds are normal.     Palpations: Abdomen is soft.     Tenderness: There is no abdominal tenderness.  Musculoskeletal:     Cervical back: Neck supple.   Lymphadenopathy:     Cervical: No cervical adenopathy.  Skin:    General: Skin is warm and dry.     Findings: No rash.  Neurological:     Mental Status: He is alert and oriented to person, place, and time.  Psychiatric:        Behavior: Behavior normal.        Thought Content: Thought content normal.        Judgment: Judgment normal.         02/20/2023    9:20 AM 01/24/2022    2:20 PM 07/16/2020    4:35 PM 03/05/2018   10:52 AM 07/24/2017   10:45 AM  Depression screen PHQ 2/9  Decreased Interest 0 0 0 0 0  Down, Depressed, Hopeless 0 0 0 0 0  PHQ - 2 Score 0 0 0 0 0       Assessment & Plan:    Patient Active Problem List   Diagnosis Date Noted   Grief 06/19/2023   Anal lesion 02/20/2023   Elevated blood pressure reading 02/20/2023   Healthcare maintenance 03/22/2021   Poor dentition 09/17/2019   Frequency of urination 09/17/2019   Condyloma 03/05/2018   Syphilis 07/24/2017   Hepatitis C without hepatic coma 06/15/2015   Essential hypertension, benign 06/15/2015   Hemorrhoid 10/28/2013   TOBACCO ABUSE 11/15/2010   HIV disease (HCC) 06/07/2010     Problem List Items Addressed This Visit       Digestive   Anal lesion - Primary    Anal lesion remains present. Previous referral not able to reach patient to schedule appointment. Will re-send referral.       Relevant Orders   Ambulatory referral to General Surgery     Other   HIV disease (HCC)    George Boyle continues to have well controlled virus with good adherence and tolerance to [employer].  Reviewed lab work and discussed plan of care, U equals U, and family planning. Check lab work. Continue current dose of Biktarvy. Plan for follow up in  6 months or sooner if needed with lab work on the same day.       Relevant Medications   bictegravir-emtricitabine-tenofovir AF (BIKTARVY) 50-200-25 MG TABS tablet   Other Relevant Orders   COMPLETE METABOLIC PANEL WITH GFR   HIV-1 RNA quant-no reflex-bld   T-helper  cell (CD4)- ([facility] clinic only)   TOBACCO ABUSE    George Boyle continues to smoke tobacco regularly. Counseled on importance of tobacco cessation to reduce risk of disease in the future. Not ready to quit at this time.       Syphilis    Previous RPR titer of 1:2 appears serofast for the past couple of years. Check RPR for continued monitoring. No treatment indicated at this time.       Relevant Medications   bictegravir-emtricitabine-tenofovir AF (BIKTARVY) 50-200-25 MG TABS tablet  Other Relevant Orders   RPR   Healthcare maintenance    Discussed importance of safe sexual practice and condom use. Condoms and STD testing offered.  Vaccinations reviewed and declined today. Declined dental referral.  Colon cancer screening up to date.       Grief    Mr. Allcock recently lost his mother who was in declining health. Currently grieving appropriately with no evidence of complicated grief. Has good support system around him. Support provided and will seek additional assistance as needed.         I am having Yeshua Erhart maintain his ibuprofen and Biktarvy.   Meds ordered this encounter  Medications   bictegravir-emtricitabine-tenofovir AF (BIKTARVY) 50-200-25 MG TABS tablet    Sig: Take 1 tablet by mouth daily.    Dispense:  30 tablet    Refill:  5    Order Specific Question:   Supervising Provider    Answer:   Judyann Munson [phone]    Order Specific Question:   Prescription Type:    Answer:   Renewal     Follow-up: Return in about 6 months (around 12/18/2023), or if symptoms worsen or fail to improve. or sooner if needed.    Marcos Eke, MSN, FNP-C Nurse Practitioner [facility] for Infectious Disease [facility] Main number: [phone]

## 2023-06-19 NOTE — Assessment & Plan Note (Signed)
Anal lesion remains present. Previous referral not able to reach patient to schedule appointment. Will re-send referral.

## 2023-06-19 NOTE — Patient Instructions (Addendum)
Nice to see you. ? ?We will check your lab work today. ? ?Continue to take your medication daily as prescribed. ? ?Refills have been sent to the pharmacy. ? ?Plan for follow up in 6 months or sooner if needed with lab work on the same day. ? ?Have a great day and stay safe! ? ?

## 2023-06-19 NOTE — Assessment & Plan Note (Signed)
Discussed importance of safe sexual practice and condom use. Condoms and STD testing offered.  Vaccinations reviewed and declined today. Declined dental referral.  Colon cancer screening up to date.

## 2023-06-19 NOTE — Assessment & Plan Note (Signed)
George Boyle recently lost his mother who was in declining health. Currently grieving appropriately with no evidence of complicated grief. Has good support system around him. Support provided and will seek additional assistance as needed.

## 2023-06-20 LAB — T-HELPER CELL (CD4) - (RCID CLINIC ONLY)
CD4 % Helper T Cell: 25 % — ABNORMAL LOW (ref 33–65)
CD4 T Cell Abs: 340 /uL — ABNORMAL LOW (ref 400–1790)

## 2023-06-23 LAB — COMPLETE METABOLIC PANEL WITH GFR
AG Ratio: 1.3 (calc) (ref 1.0–2.5)
ALT: 15 U/L (ref 9–46)
AST: 20 U/L (ref 10–35)
Albumin: 4.1 g/dL (ref 3.6–5.1)
Alkaline phosphatase (APISO): 83 U/L (ref 35–144)
BUN: 10 mg/dL (ref 7–25)
CO2: 27 mmol/L (ref 20–32)
Calcium: 9.5 mg/dL (ref 8.6–10.3)
Chloride: 105 mmol/L (ref 98–110)
Creat: 0.83 mg/dL (ref 0.70–1.30)
Globulin: 3.2 g/dL (ref 1.9–3.7)
Glucose, Bld: 89 mg/dL (ref 65–99)
Potassium: 3.7 mmol/L (ref 3.5–5.3)
Sodium: 143 mmol/L (ref 135–146)
Total Bilirubin: 0.4 mg/dL (ref 0.2–1.2)
Total Protein: 7.3 g/dL (ref 6.1–8.1)
eGFR: 103 mL/min/{1.73_m2} (ref >=60)

## 2023-06-23 LAB — RPR TITER: RPR Titer: 1:2 {titer} — ABNORMAL HIGH

## 2023-06-23 LAB — RPR: RPR Ser Ql: REACTIVE — AB

## 2023-06-23 LAB — HIV-1 RNA QUANT-NO REFLEX-BLD
HIV 1 RNA Quant: <20 {copies}/mL — ABNORMAL HIGH
HIV-1 RNA Quant, Log: <1.3 {Log} — ABNORMAL HIGH

## 2023-06-23 LAB — T PALLIDUM AB: T Pallidum Abs: POSITIVE — AB

## 2023-06-29 ENCOUNTER — Telehealth: Payer: Self-pay

## 2023-06-29 NOTE — Telephone Encounter (Signed)
Attempted to call patient regarding lab results. Not able to reach him at this time. Left voicemail stating labs look good.  Juanita Laster, RMA

## 2023-10-03 ENCOUNTER — Telehealth: Payer: Self-pay

## 2023-10-03 NOTE — Telephone Encounter (Signed)
Patient has not followed up with Dr. Bonnetta Barry office regarding abnormal anal pap. Referral has been closed as their office has been unable to get in touch with Roel.   Called patient to discuss and re-order referral, no answer. Left HIPAA compliant voicemail requesting callback.   Sandie Ano, RN

## 2023-12-04 ENCOUNTER — Ambulatory Visit (HOSPITAL_COMMUNITY)
Admission: EM | Admit: 2023-12-04 | Discharge: 2023-12-04 | Disposition: A | Discharge status: Home / Self Care | Payer: Self-pay | Attending: Family Medicine | Admitting: Family Medicine

## 2023-12-04 ENCOUNTER — Other Ambulatory Visit: Payer: Self-pay

## 2023-12-04 ENCOUNTER — Encounter (HOSPITAL_COMMUNITY): Payer: Self-pay | Admitting: Emergency Medicine

## 2023-12-04 DIAGNOSIS — R319 Hematuria, unspecified: Secondary | ICD-10-CM | POA: Insufficient documentation

## 2023-12-04 DIAGNOSIS — N3090 Cystitis, unspecified without hematuria: Secondary | ICD-10-CM

## 2023-12-04 DIAGNOSIS — N390 Urinary tract infection, site not specified: Secondary | ICD-10-CM | POA: Insufficient documentation

## 2023-12-04 DIAGNOSIS — N342 Other urethritis: Secondary | ICD-10-CM | POA: Insufficient documentation

## 2023-12-04 LAB — POCT URINALYSIS DIP (MANUAL ENTRY)
Bilirubin, UA: NEGATIVE
Glucose, UA: NEGATIVE mg/dL
Ketones, POC UA: NEGATIVE mg/dL
Nitrite, UA: NEGATIVE
Protein Ur, POC: 100 mg/dL — AB
Spec Grav, UA: 1.02 (ref 1.010–1.025)
Urobilinogen, UA: 1 U/dL
pH, UA: 6.5 (ref 5.0–8.0)

## 2023-12-04 MED ORDER — CEFTRIAXONE SODIUM 500 MG IJ SOLR
INTRAMUSCULAR | Status: AC
  Filled 2023-12-04: qty 500

## 2023-12-04 MED ORDER — CEFTRIAXONE SODIUM 500 MG IJ SOLR
500.0000 mg | INTRAMUSCULAR | Status: DC
  Administered 2023-12-04: 500 mg via INTRAMUSCULAR

## 2023-12-04 MED ORDER — SULFAMETHOXAZOLE-TRIMETHOPRIM 800-160 MG PO TABS: 1.0000 | ORAL_TABLET | Freq: Two times a day (BID) | ORAL | 0 refills | Status: AC

## 2023-12-04 MED ORDER — LIDOCAINE HCL (PF) 1 % IJ SOLN
INTRAMUSCULAR | Status: AC
  Filled 2023-12-04: qty 2

## 2023-12-04 NOTE — ED Provider Notes (Signed)
 [facility]    CSN: [id number] Arrival date & time: 12/04/23  1313      History   Chief Complaint Chief Complaint  Patient presents with   Dysuria    HPI George Boyle is a 58 y.o. male, history of HIV, hep C, elevated blood pressure, and urinary frequency. Patient here for dysuria, urgency, and frequency x 1 week. Patient is not experiencing any symptoms of nausea, vomiting, flank pain or abdominal pain.  Patient endorses significant pain in the penile region when he is urinating.  He is not having any discharge.  Reports that he is urinating nearly every time he drinks any liquids and the urge to urinate is waking him up at night.  He denies any weak stream or hematuria visible when he urinates.  No history of BPH.  Patient denies any concerns for STDs as he has been sexually active for over 2 years.  Past Medical History:  Diagnosis Date   Hepatitis C    HIV disease (HCC)    Hx of adenomatous polyp of colon 01/2020   repeat about 2028    Patient Active Problem List   Diagnosis Date Noted   Grief 06/19/2023   Anal lesion 02/20/2023   Elevated blood pressure reading 02/20/2023   Healthcare maintenance 03/22/2021   Poor dentition 09/17/2019   Frequency of urination 09/17/2019   Condyloma 03/05/2018   Syphilis 07/24/2017   Hepatitis C without hepatic coma 06/15/2015   Essential hypertension, benign 06/15/2015   Hemorrhoid 10/28/2013   TOBACCO ABUSE 11/15/2010   HIV disease (HCC) 06/07/2010    Past Surgical History:  Procedure Laterality Date   COLONOSCOPY W/ POLYPECTOMY  01/2020   UMBILICAL HERNIA REPAIR         Home Medications    Prior to Admission medications   Medication Sig Start Date End Date Taking? Authorizing Provider  sulfamethoxazole-trimethoprim (BACTRIM DS) 800-160 MG tablet Take 1 tablet by mouth 2 (two) times daily for 7 days. 12/04/23 12/11/23 Yes Bing Neighbors, NP  bictegravir-emtricitabine-tenofovir AF (BIKTARVY) 50-200-25  MG TABS tablet Take 1 tablet by mouth daily. 06/19/23   Veryl Speak, FNP  ibuprofen (ADVIL,MOTRIN) 200 MG tablet Take 800 mg by mouth every 6 (six) hours as needed for mild pain.    [provider]    Family History Family History  Problem Relation Age of Onset   CAD Mother 67       CABG 2020   Liver cancer Maternal Grandmother    Colon cancer Neg Hx    Colon polyps Neg Hx    Esophageal cancer Neg Hx    Rectal cancer Neg Hx    Stomach cancer Neg Hx     Social History Social History   Tobacco Use   Smoking status: Every Day    Current packs/day: 0.50    Average packs/day: 0.5 packs/day for 34.0 years (17.0 ttl pk-yrs)    Types: Cigarettes    Passive exposure: Past   Smokeless tobacco: Never   Tobacco comments:    Chantix gave side effect- Gave print out Quitnow 02/20/23  Vaping Use   Vaping status: Never Used  Substance Use Topics   Alcohol use: Yes    Alcohol/week: 9.0 standard drinks of alcohol    Types: 9 Cans of beer per week    Comment: beer   Drug use: No     Allergies   Patient has no known allergies.   Review of Systems Review of Systems  Genitourinary:  Positive for dysuria.     Physical Exam Triage Vital Signs ED Triage Vitals  Encounter Vitals Group     BP 12/04/23 1504 (!) 156/103     Systolic BP Percentile --      Diastolic BP Percentile --      Pulse Rate 12/04/23 1504 81     Resp 12/04/23 1504 16     Temp 12/04/23 1504 98.1 F (36.7 C)     Temp src --      SpO2 12/04/23 1504 96 %     Weight --      Height --      Head Circumference --      Peak Flow --      Pain Score 12/04/23 1502 10     Pain Loc --      Pain Education --      Exclude from Growth Chart --    No data found.  Updated Vital Signs BP (!) 156/103 (BP Location: Left Arm)   Pulse 81   Temp 98.1 F (36.7 C)   Resp 16   SpO2 96%   Visual Acuity Right Eye Distance:   Left Eye Distance:   Bilateral Distance:    Right Eye Near:   Left Eye Near:     Bilateral Near:     Physical Exam Vitals reviewed.  Constitutional:      Appearance: Normal appearance.  HENT:     Head: Normocephalic and atraumatic.  Eyes:     Extraocular Movements: Extraocular movements intact.     Pupils: Pupils are equal, round, and reactive to light.  Cardiovascular:     Rate and Rhythm: Normal rate and regular rhythm.  Pulmonary:     Effort: Pulmonary effort is normal.     Breath sounds: Normal breath sounds.  Abdominal:     Tenderness: There is no abdominal tenderness. There is no right CVA tenderness or left CVA tenderness.  Musculoskeletal:        General: Normal range of motion.  Neurological:     General: No focal deficit present.     Mental Status: He is alert.      UC Treatments / Results  Labs (all labs ordered are listed, but only abnormal results are displayed) Labs Reviewed  POCT URINALYSIS DIP (MANUAL ENTRY) - Abnormal; Notable for the following components:      Result Value   Clarity, UA cloudy (*)    Blood, UA small (*)    Protein Ur, POC =100 (*)    Leukocytes, UA Moderate (2+) (*)    All other components within normal limits  URINE CULTURE    EKG   Radiology No results found.  Procedures Procedures (including critical care time)  Medications Ordered in UC Medications  cefTRIAXone (ROCEPHIN) injection 500 mg (500 mg Intramuscular Given 12/04/23 1550)    Initial Impression / Assessment and Plan / UC Course  I have reviewed the triage vital signs and the nursing notes.  Pertinent labs & imaging results that were available during my care of the patient were reviewed by me and considered in my medical decision making (see chart for details).    Treating for a suspect UTI although can not rule urethritis as cause of symptoms. Urine culture pending. Rocephin 500 mg IM given here in clinic. Continue treatment at home with Bactrim DS BID x 7 days.  Patient is to continue antibiotics regardless if urine culture is negative  as he may have a secondary urethritis patient  is immunocompromised and given symptoms, patient should complete entire course of antibiotics.  Final Clinical Impressions(s) / UC Diagnoses   Final diagnoses:  Urinary tract infection with hematuria, site unspecified  Urethritis     Discharge Instructions      Your urine culture will result within 3 days.  We will notify you if there is any changes in treatment based on urine culture results.  If you do not hear anything from our clinic continue with current treatment as prescribed.  Hydrate well with fluids.  If you develop any fever, nausea or vomiting or severe abdominal pain go immediately to the emergency department.      ED Prescriptions     Medication Sig Dispense Auth. Provider   sulfamethoxazole-trimethoprim (BACTRIM DS) 800-160 MG tablet Take 1 tablet by mouth 2 (two) times daily for 7 days. 14 tablet Bing Neighbors, NP      PDMP not reviewed this encounter.   Bing Neighbors, NP 12/04/23 [phone]

## 2023-12-04 NOTE — Discharge Instructions (Signed)
Your urine culture will result within 3 days.  We will notify you if there is any changes in treatment based on urine culture results.  If you do not hear anything from our clinic continue with current treatment as prescribed.  Hydrate well with fluids.  If you develop any fever, nausea or vomiting or severe abdominal pain go immediately to the emergency department.

## 2023-12-04 NOTE — ED Triage Notes (Addendum)
 For a week, patient has felt pain at the tip of his penis. Denies a penile discharge.  Patient has had pain with urination and has noticed an increase in having to urinate

## 2023-12-05 LAB — URINE CULTURE: Culture: >=100000 — AB

## 2024-01-17 NOTE — Progress Notes (Signed)
 The ASCVD Risk score (Arnett DK, et al., 2019) failed to calculate for the following reasons:   The valid HDL cholesterol range is 20 to 100 mg/dL  Arlon Bergamo, BSN, RN

## 2024-02-28 ENCOUNTER — Other Ambulatory Visit: Payer: Self-pay | Admitting: Family

## 2024-02-28 DIAGNOSIS — B20 Human immunodeficiency virus [HIV] disease: Secondary | ICD-10-CM

## 2024-08-13 ENCOUNTER — Other Ambulatory Visit: Payer: Self-pay

## 2024-08-13 ENCOUNTER — Encounter: Payer: Self-pay | Admitting: Family

## 2024-08-13 ENCOUNTER — Ambulatory Visit: Payer: Self-pay | Admitting: Family

## 2024-08-13 VITALS — BP 136/87 | HR 78 | Temp 98.5°F | Wt 112.0 lb

## 2024-08-13 DIAGNOSIS — Z79899 Other long term (current) drug therapy: Secondary | ICD-10-CM

## 2024-08-13 DIAGNOSIS — Z0000 Encounter for general adult medical examination without abnormal findings: Secondary | ICD-10-CM

## 2024-08-13 DIAGNOSIS — Z113 Encounter for screening for infections with a predominantly sexual mode of transmission: Secondary | ICD-10-CM

## 2024-08-13 DIAGNOSIS — B20 Human immunodeficiency virus [HIV] disease: Secondary | ICD-10-CM

## 2024-08-13 DIAGNOSIS — Z23 Encounter for immunization: Secondary | ICD-10-CM

## 2024-08-13 MED ORDER — BIKTARVY 50-200-25 MG PO TABS: 1.0000 | ORAL_TABLET | Freq: Every day | ORAL | 6 refills | Status: AC

## 2024-08-13 NOTE — Assessment & Plan Note (Signed)
 George Boyle continues to have well-controlled virus with good adherence and tolerance to Biktarvy .  Reviewed previous lab work and discussed plan of care and U equals U.  Emphasized importance of routine follow-up to ensure continued viral suppression and reduce risk of disease progression and complications in the future.  No problems obtaining medication and covered by ADAP and will need to renew with paystub's brought to the financial team.  Social determinants of health reviewed with no interventions indicated.  Check blood work.  Continue current dose of Biktarvy .  Plan for follow-up in 6 months or sooner if needed with lab work on the same day.

## 2024-08-13 NOTE — Assessment & Plan Note (Signed)
 Discussed importance of safe sexual practice and condom use. Condoms and site specific STD testing offered.  Vaccinations reviewed and influenza and Prevnar 20 updated following counseling. Colon cancer screening up to date.  Due for routine dental care and declined referral to [facility].

## 2024-08-13 NOTE — Patient Instructions (Addendum)
 Nice to see you.  We will check your lab work today.  Continue to take your medication daily as prescribed.  Refills have been sent to the pharmacy.  PAYSTUBS to [facility]!  Plan for follow up in 6 months or sooner if needed with lab work on the same day.  Have a great day and stay safe!   Smoking Cessation: QuitlineNC 1-800-QUIT-NOW 540-405-1362); Espaol: 1-855-Djelo-Ya (1-803-337-5722) http://carroll-castaneda.info/

## 2024-08-13 NOTE — Progress Notes (Signed)
 Brief Narrative   Patient ID: George Boyle, male    DOB: [birth year], 58 y.o.   MRN: [id number]  George Boyle is a 58 y/o AA male diagnosed with HIV diseas ein 2000 with risk factor of MSM. Initial CD4 and viral load unavailable. Genotype from November 2015 with no medication resistance patterns. No history of opportunistic infection. Previous ART history of Atripla . Now on Biktarvy .   Subjective:   Chief Complaint  Patient presents with   Follow-up    Flu shot today/     HPI:  George Boyle is a 58 y.o. male with HIV disease last seen on 06/19/2023 with well-controlled virus and good adherence and tolerance to Biktarvy .  Viral load was undetectable with CD4 count 340.  RPR titer stable at 1: 2.  Kidney function, liver function, electrolytes within normal ranges.  Here today for routine follow-up.  George Boyle has been doing okay since his last office visit and continues to take Biktarvy  as prescribed with no adverse side effects or problems obtaining medication from the pharmacy.  Covered by [employer].  Needing to renew financial assistance and bring in his pay stubs.  Working full-time with stable housing, transportation, and access to food.  Due for routine dental care.  Healthcare maintenance reviewed.  Not currently sexually active. No new concerns/complaints.   Denies fevers, chills, night sweats, headaches, changes in vision, neck pain/stiffness, nausea, diarrhea, vomiting, lesions or rashes.  Lab Results  Component Value Date   CD4TCELL 25 (L) 06/19/2023   CD4TABS 340 (L) 06/19/2023   Lab Results  Component Value Date   HIV1RNAQUANT <20 (H) 06/19/2023     No Known Allergies    Outpatient Medications Prior to Visit  Medication Sig Dispense Refill   ibuprofen (ADVIL,MOTRIN) 200 MG tablet Take 800 mg by mouth every 6 (six) hours as needed for mild pain.     BIKTARVY  50-200-25 MG TABS tablet TAKE 1 TABLET BY MOUTH DAILY 30 tablet 0   No  facility-administered medications prior to visit.     Past Medical History:  Diagnosis Date   Hepatitis C    HIV disease (HCC)    Hx of adenomatous polyp of colon 01/2020   repeat about 2028     Past Surgical History:  Procedure Laterality Date   COLONOSCOPY W/ POLYPECTOMY  01/2020   UMBILICAL HERNIA REPAIR          Review of Systems  Constitutional:  Negative for appetite change, chills, fatigue, fever and unexpected weight change.  Eyes:  Negative for visual disturbance.  Respiratory:  Negative for cough, chest tightness, shortness of breath and wheezing.   Cardiovascular:  Negative for chest pain and leg swelling.  Gastrointestinal:  Negative for abdominal pain, constipation, diarrhea, nausea and vomiting.  Genitourinary:  Negative for dysuria, flank pain, frequency, genital sores, hematuria and urgency.  Skin:  Negative for rash.  Allergic/Immunologic: Negative for immunocompromised state.  Neurological:  Negative for dizziness and headaches.     Objective:   BP 136/87   Pulse 78   Temp 98.5 F (36.9 C) (Oral)   Wt 112 lb (50.8 kg)   SpO2 98%   BMI 17.54 kg/m  Nursing note and vital signs reviewed.  Physical Exam Constitutional:      General: He is not in acute distress.    Appearance: He is well-developed.  Eyes:     Conjunctiva/sclera: Conjunctivae normal.  Cardiovascular:     Rate and Rhythm: Normal rate and regular rhythm.  Heart sounds: Normal heart sounds. No murmur heard.    No friction rub. No gallop.  Pulmonary:     Effort: Pulmonary effort is normal. No respiratory distress.     Breath sounds: Normal breath sounds. No wheezing or rales.  Chest:     Chest wall: No tenderness.  Abdominal:     General: Bowel sounds are normal.     Palpations: Abdomen is soft.     Tenderness: There is no abdominal tenderness.  Musculoskeletal:     Cervical back: Neck supple.  Lymphadenopathy:     Cervical: No cervical adenopathy.  Skin:    General:  Skin is warm and dry.     Findings: No rash.  Neurological:     Mental Status: He is alert and oriented to person, place, and time.          08/13/2024    2:50 PM 02/20/2023    9:20 AM 01/24/2022    2:20 PM 07/16/2020    4:35 PM 03/05/2018   10:52 AM  Depression screen PHQ 2/9  Decreased Interest 0 0 0 0 0  Down, Depressed, Hopeless 0 0 0 0 0  PHQ - 2 Score 0 0 0 0 0  Altered sleeping 0      Tired, decreased energy 0      Change in appetite 0      Feeling bad or failure about yourself  0      Trouble concentrating 0      Moving slowly or fidgety/restless 0      Suicidal thoughts 0      PHQ-9 Score 0      Difficult doing work/chores Not difficult at all            08/13/2024    2:50 PM  GAD 7 : Generalized Anxiety Score  Nervous, Anxious, on Edge 0  Control/stop worrying 0  Worry too much - different things 0  Trouble relaxing 0  Restless 0  Easily annoyed or irritable 0  Afraid - awful might happen 0  Total GAD 7 Score 0  Anxiety Difficulty Not difficult at all     The ASCVD Risk score (Arnett DK, et al., 2019) failed to calculate for the following reasons:   The valid HDL cholesterol range is 20 to 100 mg/dL      Assessment & Plan:    Patient Active Problem List   Diagnosis Date Noted   Grief 06/19/2023   Anal lesion 02/20/2023   Elevated blood pressure reading 02/20/2023   Healthcare maintenance 03/22/2021   Poor dentition 09/17/2019   Frequency of urination 09/17/2019   Condyloma 03/05/2018   Syphilis 07/24/2017   Hepatitis C without hepatic coma 06/15/2015   Essential hypertension, benign 06/15/2015   Hemorrhoid 10/28/2013   TOBACCO ABUSE 11/15/2010   HIV disease (HCC) 06/07/2010     Problem List Items Addressed This Visit       Other   HIV disease [facility])   [name] continues to have well-controlled virus with good adherence and tolerance to Biktarvy .  Reviewed previous lab work and discussed plan of care and U equals U.  Emphasized  importance of routine follow-up to ensure continued viral suppression and reduce risk of disease progression and complications in the future.  No problems obtaining medication and covered by ADAP and will need to renew with paystub's brought to the financial team.  Social determinants of health reviewed with no interventions indicated.  Check blood work.  Continue current dose  of Biktarvy .  Plan for follow-up in 6 months or sooner if needed with lab work on the same day.      Relevant Medications   bictegravir-emtricitabine -tenofovir  AF (BIKTARVY ) 50-200-25 MG TABS tablet   Other Relevant Orders   Comprehensive metabolic panel with GFR   HIV-1 RNA quant-no reflex-bld   T-helper cell (CD4)- ([facility] clinic only)   Flu vaccine trivalent PF, 6mos and older(Flulaval,Afluria,Fluarix,Fluzone) (Completed)   Pneumococcal conjugate vaccine 20-valent (Completed)   Healthcare maintenance   Discussed importance of safe sexual practice and condom use. Condoms and site specific STD testing offered.  Vaccinations reviewed and influenza and Prevnar 20 updated following counseling. Colon cancer screening up to date.  Due for routine dental care and declined referral to [facility].        Other Visit Diagnoses       Pharmacologic therapy    -  Primary   Relevant Orders   Lipid panel     Screening for STDs (sexually transmitted diseases)       Relevant Orders   CBC with Differential/Platelet     Need for influenza vaccination       Relevant Orders   Flu vaccine trivalent PF, 6mos and older(Flulaval,Afluria,Fluarix,Fluzone) (Completed)   Pneumococcal conjugate vaccine 20-valent (Completed)     Need for pneumococcal 20-valent conjugate vaccination       Relevant Orders   Flu vaccine trivalent PF, 6mos and older(Flulaval,Afluria,Fluarix,Fluzone) (Completed)   Pneumococcal conjugate vaccine 20-valent (Completed)        I have changed Urie Selkirk's Biktarvy . I am also having him maintain his  ibuprofen.   Meds ordered this encounter  Medications   bictegravir-emtricitabine -tenofovir  AF (BIKTARVY ) 50-200-25 MG TABS tablet    Sig: Take 1 tablet by mouth daily.    Dispense:  30 tablet    Refill:  6    Supervising Provider:   LUIZ CHANNEL [phone]    Prescription Type::   Renewal     Follow-up: Return in about 6 months (around 02/11/2025). or sooner if needed.    Cathlyn July, MSN, FNP-C Nurse Practitioner [facility] Main number: [phone]

## 2024-08-14 LAB — T-HELPER CELL (CD4) - (RCID CLINIC ONLY)
CD4 % Helper T Cell: 22 % — ABNORMAL LOW (ref 33–65)
CD4 T Cell Abs: 279 /uL — ABNORMAL LOW (ref 400–1790)

## 2024-08-15 LAB — COMPREHENSIVE METABOLIC PANEL WITH GFR
AG Ratio: 1.4 (calc) (ref 1.0–2.5)
ALT: 19 U/L (ref 9–46)
AST: 28 U/L (ref 10–35)
Albumin: 4.2 g/dL (ref 3.6–5.1)
Alkaline phosphatase (APISO): 56 U/L (ref 35–144)
BUN: 12 mg/dL (ref 7–25)
CO2: 31 mmol/L (ref 20–32)
Calcium: 9.3 mg/dL (ref 8.6–10.3)
Chloride: 103 mmol/L (ref 98–110)
Creat: 0.75 mg/dL (ref 0.70–1.30)
Globulin: 3.1 g/dL (ref 1.9–3.7)
Glucose, Bld: 100 mg/dL — ABNORMAL HIGH (ref 65–99)
Potassium: 3.9 mmol/L (ref 3.5–5.3)
Sodium: 139 mmol/L (ref 135–146)
Total Bilirubin: 0.5 mg/dL (ref 0.2–1.2)
Total Protein: 7.3 g/dL (ref 6.1–8.1)
eGFR: 105 mL/min/1.73m2 (ref >=60)

## 2024-08-15 LAB — CBC WITH DIFFERENTIAL/PLATELET
Absolute Lymphocytes: 1343 {cells}/uL (ref 850–3900)
Absolute Monocytes: 668 {cells}/uL (ref 200–950)
Basophils Absolute: 38 {cells}/uL (ref 0–200)
Basophils Relative: 0.5 %
Eosinophils Absolute: 83 {cells}/uL (ref 15–500)
Eosinophils Relative: 1.1 %
HCT: 40.7 % (ref 39.4–51.1)
Hemoglobin: 13.1 g/dL — ABNORMAL LOW (ref 13.2–17.1)
MCH: 30.7 pg (ref 27.0–33.0)
MCHC: 32.2 g/dL (ref 31.6–35.4)
MCV: 95.3 fL (ref 81.4–101.7)
MPV: 9.8 fL (ref 7.5–12.5)
Monocytes Relative: 8.9 %
Neutro Abs: 5370 {cells}/uL (ref 1500–7800)
Neutrophils Relative %: 71.6 %
Platelets: 356 Thousand/uL (ref 140–400)
RBC: 4.27 Million/uL (ref 4.20–5.80)
RDW: 12.4 % (ref 11.0–15.0)
Total Lymphocyte: 17.9 %
WBC: 7.5 Thousand/uL (ref 3.8–10.8)

## 2024-08-15 LAB — LIPID PANEL
Cholesterol: 211 mg/dL — ABNORMAL HIGH (ref <200)
HDL: 139 mg/dL (ref >=40)
LDL Cholesterol (Calc): 58 mg/dL
Non-HDL Cholesterol (Calc): 72 mg/dL (ref <130)
Total CHOL/HDL Ratio: 1.5 (calc) (ref <5.0)
Triglycerides: 63 mg/dL (ref <150)

## 2024-08-15 LAB — HIV-1 RNA QUANT-NO REFLEX-BLD
HIV 1 RNA Quant: NOT DETECTED {copies}/mL
HIV-1 RNA Quant, Log: NOT DETECTED {Log_copies}/mL

## 2024-08-16 ENCOUNTER — Ambulatory Visit: Payer: Self-pay | Admitting: Family
